# Patient Record
Sex: Female | Born: 1995
Health system: Southern US, Community
[De-identification: ages and names within clinical notes are randomized; demographics above are authoritative.]

## PROBLEM LIST (undated history)

## (undated) DIAGNOSIS — G540 Brachial plexus disorders: Secondary | ICD-10-CM

## (undated) DIAGNOSIS — L709 Acne, unspecified: Secondary | ICD-10-CM

## (undated) DIAGNOSIS — M419 Scoliosis, unspecified: Secondary | ICD-10-CM

## (undated) DIAGNOSIS — F32A Depression, unspecified: Secondary | ICD-10-CM

## (undated) HISTORY — PX: THORACIC OUTLET SURGERY: SHX2502

## (undated) HISTORY — DX: Acne, unspecified: L70.9

## (undated) HISTORY — DX: Scoliosis, unspecified: M41.9

## (undated) HISTORY — DX: Brachial plexus disorders: G54.0

## (undated) HISTORY — DX: Depression, unspecified: F32.A

---
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
## 2017-01-30 MED FILL — NORG-ETHIN ESTRA 0.25-0.035: 0.25-35 | 84 days supply | Qty: 84 | Fill #1

## 2017-04-21 MED FILL — MONO-LINYAH 28 TABLET: 0.25-35 | 84 days supply | Qty: 84 | Fill #2

## 2017-05-16 DIAGNOSIS — J029 Acute pharyngitis, unspecified: Secondary | ICD-10-CM | POA: Diagnosis not present

## 2017-07-21 DIAGNOSIS — H52223 Regular astigmatism, bilateral: Secondary | ICD-10-CM | POA: Diagnosis not present

## 2017-08-14 MED FILL — MONO-LINYAH 28 TABLET: 0.25-35 | 84 days supply | Qty: 84 | Fill #3

## 2017-10-22 DIAGNOSIS — L700 Acne vulgaris: Secondary | ICD-10-CM | POA: Diagnosis not present

## 2017-10-22 MED FILL — CLINDAMYCIN PHOS-BENZOYL PE: 1-5 | 30 days supply | Qty: 50 | Fill #0

## 2017-10-22 MED FILL — PREVIFEM 0.25-35 MG-MCG TAB: 0.25-35 | 84 days supply | Qty: 84 | Fill #0

## 2017-12-14 ENCOUNTER — Ambulatory Visit (INDEPENDENT_AMBULATORY_CARE_PROVIDER_SITE_OTHER): Payer: 59 | Admitting: Family Medicine

## 2017-12-14 ENCOUNTER — Ambulatory Visit (INDEPENDENT_AMBULATORY_CARE_PROVIDER_SITE_OTHER): Payer: 59

## 2017-12-14 ENCOUNTER — Encounter: Payer: Self-pay | Admitting: Family Medicine

## 2017-12-14 VITALS — BP 122/59 | HR 65 | Wt 144.0 lb

## 2017-12-14 DIAGNOSIS — M4126 Other idiopathic scoliosis, lumbar region: Secondary | ICD-10-CM

## 2017-12-14 DIAGNOSIS — M24852 Other specific joint derangements of left hip, not elsewhere classified: Secondary | ICD-10-CM

## 2017-12-14 DIAGNOSIS — M48061 Spinal stenosis, lumbar region without neurogenic claudication: Secondary | ICD-10-CM | POA: Diagnosis not present

## 2017-12-14 DIAGNOSIS — M5416 Radiculopathy, lumbar region: Secondary | ICD-10-CM

## 2017-12-14 DIAGNOSIS — M419 Scoliosis, unspecified: Secondary | ICD-10-CM

## 2017-12-14 DIAGNOSIS — M7062 Trochanteric bursitis, left hip: Secondary | ICD-10-CM

## 2017-12-14 DIAGNOSIS — M25552 Pain in left hip: Secondary | ICD-10-CM | POA: Diagnosis not present

## 2017-12-14 DIAGNOSIS — M545 Low back pain: Secondary | ICD-10-CM | POA: Diagnosis not present

## 2017-12-14 HISTORY — DX: Scoliosis, unspecified: M41.9

## 2017-12-14 NOTE — Progress Notes (Signed)
Belinda Bryan is a 22 y.o. female who presents to [facility] today for hip pain and popping.   She first noticed pain and popping in her left hip 2 years ago when she was playing sand volleyball and felt a pop in her hip. She describes it as popped out of place and then popped back into place. Since this incident she reports pain and numbness in her leg and hip 1-2x monthly. She says her leg gets numb in what sounds like the L4 distribution down to her foot. She also reports recently feeling another large pop which she describes as popped out of place and then popped back in when she was doing an external rotation move while stretching in health class. She does not take anything for pain. She says the pain is both in the lateral hip and in the groin.    ROS:  No headache, visual changes, nausea, vomiting, diarrhea, constipation, dizziness, abdominal pain, skin rash, fevers, chills, night sweats, weight loss, swollen lymph nodes, body aches, joint swelling, muscle aches, chest pain, shortness of breath, mood changes, visual or auditory hallucinations.    Exam:  BP (!) 122/59   Pulse 65   Wt 144 lb (65.3 kg)   BMI 23.24 kg/m  General: Well Developed, well nourished, and in no acute distress.  Neuro/Psych: Alert and oriented x3, extra-ocular muscles intact, able to move all 4 extremities, sensation grossly intact. Skin: Warm and dry, no rashes noted.  Respiratory: Not using accessory muscles, speaking in full sentences, trachea midline.  Cardiovascular: Pulses palpable, no extremity edema. Abdomen: Does not appear distended. MSK: L-spine: Nontender to spinal midline.  Normal lumbar motion. Positive left-sided slump test Lower extremity reflexes are equal normal throughout bilaterally.  Right hip: Normal-appearing nontender normal motion.  Hip abduction strength diminished 4+/5.  Left hip: Normal-appearing normal motion.  Tender palpation  greater trochanter. Hip abduction strength diminished 4/5.  Hip external rotation strength intact 5/5.  Leg lengths equal bilaterally.   Lab and Radiology Results X-ray images personally independently reviewed  L-spine: Mild scoliosis convex to the right.  No severe degenerative changes.  Mild neuroforaminal narrowing at L4.  No acute fractures or changes.  Left hip: Normal-appearing no degenerative changes no fractures.  Await formal radiology review.   Assessment and Plan: 22 y.o. female with left hip pain. Her radicular pain is likely due to pinched L4 nerve root secondary to her scoliosis. Her pain distribution is perfectly in the L4 distribution and her slump test was positive. 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 Her x ray was normal which is reassuring that she does not have a congential hip dysplasia, but we are still waiting on a formal radiology report. The plan will be to do physical therapy for her snapping hip syndrome and recheck. As for her L4 radicular symptoms, she is only having symptoms 1-2x monthly. We will not start medications or do further testing at this time. If her symptoms worsen or do not resolve, an L spine MRI could be indicated.     Orders Placed This Encounter  Procedures  . DG Lumbar Spine Complete    Standing Status:   Future    Number of Occurrences:   1    Standing Expiration Date:   02/14/2019    Order Specific Question:   Reason for Exam (SYMPTOM  OR DIAGNOSIS REQUIRED)    Answer:   eval ? left  L4 nerve radicular pain    Order Specific Question:   Is patient pregnant?    Answer:   No    Order Specific Question:   Preferred imaging location?    Answer:   [facility]    Order Specific Question:   Radiology Contrast Protocol - do NOT remove file path    Answer:   \\[email or web address].[email or web address]  . DG HIP UNILAT WITH PELVIS 2-3 VIEWS LEFT     Standing Status:   Future    Number of Occurrences:   1    Standing Expiration Date:   02/14/2019    Order Specific Question:   Reason for Exam (SYMPTOM  OR DIAGNOSIS REQUIRED)    Answer:   eval ? hip sublx    Order Specific Question:   Is patient pregnant?    Answer:   No    Order Specific Question:   Preferred imaging location?    Answer:   [facility]    Order Specific Question:   Radiology Contrast Protocol - do NOT remove file path    Answer:   \\[email or web address].[email or web address]  . Ambulatory referral to Physical Therapy    Referral Priority:   Routine    Referral Type:   Physical Medicine    Referral Reason:   Specialty Services Required    Requested Specialty:   Physical Therapy    Number of Visits Requested:   1   No orders of the defined types were placed in this encounter.   Historical information moved to improve visibility of documentation.  Past Medical History:  Diagnosis Date  . Acne 07/16/2014  . Lumbar scoliosis 12/14/2017  . Thoracic outlet syndrome 05/17/2014   Past Surgical History:  Procedure Laterality Date  . THORACIC OUTLET SURGERY     Social History   Tobacco Use  . Smoking status: Never Smoker  . Smokeless tobacco: Never Used  Substance Use Topics  . Alcohol use: No    Alcohol/week: 0.0 standard drinks   family history includes COPD in her paternal grandmother; Heart block in her paternal grandmother; Hyperlipidemia in her maternal grandmother and paternal grandfather; Hypertension in her maternal grandmother.  Medications: Current Outpatient Medications  Medication Sig Dispense Refill  . Multiple Vitamins-Minerals (MULTIVITAMIN WITH MINERALS) tablet Take 1 tablet by mouth daily.    . norgestimate-ethinyl estradiol (ORTHO-CYCLEN,SPRINTEC,PREVIFEM) 0.25-35 MG-MCG tablet Take 1 tablet by mouth daily.    . tazarotene (TAZORAC) 0.05 % cream Apply topically at bedtime.     No current facility-administered medications  for this visit.    No Known Allergies    Discussed warning signs or symptoms. Please see discharge instructions. Patient expresses understanding.  I personally was present and performed or re-performed the history, physical exam and medical decision-making activities of this service and have verified that the service and findings are accurately documented in the student's note. ___________________________________________ Clementeen Graham M.D., ABFM., CAQSM. Primary Care and Sports Medicine Adjunct Instructor of Family Medicine  [employer]

## 2017-12-14 NOTE — Patient Instructions (Signed)
Thank you for coming in today. I think you have a pinched nerve in the back.  I think you have weak hip muscles (hip abductors) Attend PT.  If the radiating pain gets bad let me know and I will prescribe prednisone and gabapentin.   Come back or go to the emergency room if you notice new weakness new numbness problems walking or bowel or bladder problems.   Recheck in about 2 months.   Return sooner if needed.    Sciatica Sciatica is pain, numbness, weakness, or tingling along the path of the sciatic nerve. The sciatic nerve starts in the lower back and runs down the back of each leg. The nerve controls the muscles in the lower leg and in the back of the knee. It also provides feeling (sensation) to the back of the thigh, the lower leg, and the sole of the foot. Sciatica is a symptom of another medical condition that pinches or puts pressure on the sciatic nerve. Generally, sciatica only affects one side of the body. Sciatica usually goes away on its own or with treatment. In some cases, sciatica may keep coming back (recur). What are the causes? This condition is caused by pressure on the sciatic nerve, or pinching of the sciatic nerve. This may be the result of:  A disk in between the bones of the spine (vertebrae) bulging out too far (herniated disk).  Age-related changes in the spinal disks (degenerative disk disease).  A pain disorder that affects a muscle in the buttock (piriformis syndrome).  Extra bone growth (bone spur) near the sciatic nerve.  An injury or break (fracture) of the pelvis.  Pregnancy.  Tumor (rare).  What increases the risk? The following factors may make you more likely to develop this condition:  Playing sports that place pressure or stress on the spine, such as football or weight lifting.  Having poor strength and flexibility.  A history of back injury.  A history of back surgery.  Sitting for long periods of time.  Doing activities that  involve repetitive bending or lifting.  Obesity.  What are the signs or symptoms? Symptoms can vary from mild to very severe, and they may include:  Any of these problems in the lower back, leg, hip, or buttock: ? Mild tingling or dull aches. ? Burning sensations. ? Sharp pains.  Numbness in the back of the calf or the sole of the foot.  Leg weakness.  Severe back pain that makes movement difficult.  These symptoms may get worse when you cough, sneeze, or laugh, or when you sit or stand for long periods of time. Being overweight may also make symptoms worse. In some cases, symptoms may recur over time. How is this diagnosed? This condition may be diagnosed based on:  Your symptoms.  A physical exam. Your health care provider may ask you to do certain movements to check whether those movements trigger your symptoms.  You may have tests, including: ? Blood tests. ? X-rays. ? MRI. ? CT scan.  How is this treated? In many cases, this condition improves on its own, without any treatment. However, treatment may include:  Reducing or modifying physical activity during periods of pain.  Exercising and stretching to strengthen your abdomen and improve the flexibility of your spine.  Icing and applying heat to the affected area.  Medicines that help: ? To relieve pain and swelling. ? To relax your muscles.  Injections of medicines that help to relieve pain, irritation, and inflammation  around the sciatic nerve (steroids).  Surgery.  Follow these instructions at home: Medicines  Take over-the-counter and prescription medicines only as told by your health care provider.  Do not drive or operate heavy machinery while taking prescription pain medicine. Managing pain  If directed, apply ice to the affected area. ? Put ice in a plastic bag. ? Place a towel between your skin and the bag. ? Leave the ice on for 20 minutes, 2-3 times a day.  After icing, apply heat to the  affected area before you exercise or as often as told by your health care provider. Use the heat source that your health care provider recommends, such as a moist heat pack or a heating pad. ? Place a towel between your skin and the heat source. ? Leave the heat on for 20-30 minutes. ? Remove the heat if your skin turns bright red. This is especially important if you are unable to feel pain, heat, or cold. You may have a greater risk of getting burned. Activity  Return to your normal activities as told by your health care provider. Ask your health care provider what activities are safe for you. ? Avoid activities that make your symptoms worse.  Take brief periods of rest throughout the day. Resting in a lying or standing position is usually better than sitting to rest. ? When you rest for longer periods, mix in some mild activity or stretching between periods of rest. This will help to prevent stiffness and pain. ? Avoid sitting for long periods of time without moving. Get up and move around at least one time each hour.  Exercise and stretch regularly, as told by your health care provider.  Do not lift anything that is heavier than 10 lb (4.5 kg) while you have symptoms of sciatica. When you do not have symptoms, you should still avoid heavy lifting, especially repetitive heavy lifting.  When you lift objects, always use proper lifting technique, which includes: ? Bending your knees. ? Keeping the load close to your body. ? Avoiding twisting. General instructions  Use good posture. ? Avoid leaning forward while sitting. ? Avoid hunching over while standing.  Maintain a healthy weight. Excess weight puts extra stress on your back and makes it difficult to maintain good posture.  Wear supportive, comfortable shoes. Avoid wearing high heels.  Avoid sleeping on a mattress that is too soft or too hard. A mattress that is firm enough to support your back when you sleep may help to reduce  your pain.  Keep all follow-up visits as told by your health care provider. This is important. Contact a health care provider if:  You have pain that wakes you up when you are sleeping.  You have pain that gets worse when you lie down.  Your pain is worse than you have experienced in the past.  Your pain lasts longer than 4 weeks.  You experience unexplained weight loss. Get help right away if:  You lose control of your bowel or bladder (incontinence).  You have: ? Weakness in your lower back, pelvis, buttocks, or legs that gets worse. ? Redness or swelling of your back. ? A burning sensation when you urinate. This information is not intended to replace advice given to you by your health care provider. Make sure you discuss any questions you have with your health care provider. Document Released: 02/18/2001 Document Revised: 07/31/2015 Document Reviewed: 11/03/2014 Elsevier Interactive Patient Education  2018 [employer]    Snapping Hip  Syndrome Rehab Ask your health care provider which exercises are safe for you. Do exercises exactly as told by your health care provider and adjust them as directed. It is normal to feel mild stretching, pulling, tightness, or discomfort as you do these exercises, but you should stop right away if you feel sudden pain or your pain gets worse. Do not begin these exercises until told by your health care provider. Stretching and range of motion exercises These exercises warm up your muscles and joints and improve the movement and flexibility of your hip and pelvis. These exercises also help to relieve pain and stiffness. Exercise A: Hip rotators  1. Lie on your back on a firm surface. 2. Pull your left / right knee toward your same shoulder with your left / right hand until your knee is pointing toward the ceiling. Hold your left / right ankle with your other hand. 3. Keeping your knee steady, gently pull your ankle toward your other shoulder until  you feel a stretch in your buttocks. 4. Hold this position for __________ seconds. 5. Slowly return to the starting position. Repeat __________ times. Complete this stretch __________ times a day. Exercise B: Iliotibial band  1. Lie on your side with your left / right leg in the top position. 2. Bend your left / right knee and grab your ankle. 3. Slowly bring your knee back so your thigh is behind your body. 4. Slowly lower your knee toward the floor until you feel a gentle stretch on the outside of your left / right thigh. If you do not feel a stretch and your knee will not fall farther, place the heel of your other foot on top of your outer knee and pull your thigh down farther. 5. Hold this position for __________ seconds. 6. Slowly return to the starting position. Repeat __________ times. Complete this stretch __________ times a day. Exercise C: Lunge ( hip flexors) 1. Kneel on the floor on your left / right knee. Bend your other knee so it is directly over your ankle. 2. Keep good posture with your head over your shoulders. Tuck your tailbone underneath you. This will prevent your back from arching too much. 3. You should feel a gentle stretch in the front of your thigh or hip. If you do not feel a stretch, slowly lunge forward with your chest up. 4. Hold this position for __________ seconds. Repeat __________ times. Complete this exercise __________ times a day. Strengthening exercises These exercises build strength and endurance in your hip and pelvis. Endurance is the ability to use your muscles for a long time, even after they get tired. Exercise D: Straight leg raises ( hip abductors) 1. Lie on your side so your left / right leg is in the top position. Lie so your head, shoulder, knee, and hip line up. Bend your bottom knee to help you balance. 2. Lift your top leg up 4-6 inches (10-15 cm), keeping your toes pointed straight ahead. 3. Hold this position for __________  seconds. 4. Slowly lower your leg to the starting position. Let your muscles relax completely. Repeat __________ times. Complete this exercise __________ times a day. Exercise E: Hip abductors and rotators, quadruped  1. Get on your hands and knees on a firm, lightly padded surface. Your hands should be directly below your shoulders, and your knees should be directly below your hips. 2. Lift your left / right knee out to the side. Keep your knee bent. Do not twist your  body. 3. Hold this position for __________ seconds. 4. Slowly lower your leg. Repeat __________ times. Complete this exercise __________ times a day. This information is not intended to replace advice given to you by your health care provider. Make sure you discuss any questions you have with your health care provider. Document Released: 02/24/2005 Document Revised: 10/30/2015 Document Reviewed: 02/06/2015 Elsevier Interactive Patient Education  [employer].

## 2017-12-31 ENCOUNTER — Encounter: Payer: Self-pay | Admitting: Family Medicine

## 2017-12-31 ENCOUNTER — Telehealth: Payer: Self-pay

## 2017-12-31 MED ORDER — GABAPENTIN 300 MG PO CAPS: ORAL_CAPSULE | ORAL | 3 refills | Status: DC

## 2017-12-31 MED ORDER — PREDNISONE 5 MG (48) PO TBPK: ORAL_TABLET | ORAL | 0 refills | Status: DC

## 2017-12-31 MED FILL — GABAPENTIN 300 MG CAPSULE: 300 | 60 days supply | Qty: 180 | Fill #0

## 2017-12-31 MED FILL — predniSONE 5 MG TABS: 5 | 12 days supply | Qty: 48 | Fill #0

## 2017-12-31 NOTE — Telephone Encounter (Signed)
Prednisone and gabapentin sent to pharmacy.  Letter printed and will be faxed.

## 2017-12-31 NOTE — Telephone Encounter (Signed)
Pt advised. No further questions.  

## 2017-12-31 NOTE — Telephone Encounter (Signed)
Patient called stated that she needs a letter stating that she can not participate in the Pacer Test due to her having hip pain and nerve issues. Patient is wanting to go ahead and have the Gabapentin and Prednisone that was discussed during her visit to be sent to [facility] pharmacy. Please advise letter is to be faxed to [phone]. Jasmine Mcbeth,CMA

## 2018-01-22 DIAGNOSIS — R531 Weakness: Secondary | ICD-10-CM | POA: Diagnosis not present

## 2018-01-22 DIAGNOSIS — M79662 Pain in left lower leg: Secondary | ICD-10-CM | POA: Diagnosis not present

## 2018-01-22 DIAGNOSIS — M25552 Pain in left hip: Secondary | ICD-10-CM | POA: Diagnosis not present

## 2018-01-22 DIAGNOSIS — M545 Low back pain: Secondary | ICD-10-CM | POA: Diagnosis not present

## 2018-01-27 DIAGNOSIS — R531 Weakness: Secondary | ICD-10-CM | POA: Diagnosis not present

## 2018-01-27 DIAGNOSIS — M79662 Pain in left lower leg: Secondary | ICD-10-CM | POA: Diagnosis not present

## 2018-01-27 DIAGNOSIS — M545 Low back pain: Secondary | ICD-10-CM | POA: Diagnosis not present

## 2018-01-27 DIAGNOSIS — M25552 Pain in left hip: Secondary | ICD-10-CM | POA: Diagnosis not present

## 2018-02-03 ENCOUNTER — Telehealth: Payer: Self-pay | Admitting: Family Medicine

## 2018-02-03 DIAGNOSIS — M545 Low back pain: Secondary | ICD-10-CM | POA: Diagnosis not present

## 2018-02-03 DIAGNOSIS — R531 Weakness: Secondary | ICD-10-CM | POA: Diagnosis not present

## 2018-02-03 DIAGNOSIS — M25552 Pain in left hip: Secondary | ICD-10-CM | POA: Diagnosis not present

## 2018-02-03 DIAGNOSIS — M79662 Pain in left lower leg: Secondary | ICD-10-CM | POA: Diagnosis not present

## 2018-02-03 NOTE — Telephone Encounter (Signed)
Completed form for [facility] regarding PACER test. Will fax back today.

## 2018-02-05 MED FILL — PREVIFEM 0.25-35 MG-MCG TAB: 0.25-35 | 84 days supply | Qty: 84 | Fill #1

## 2018-02-12 DIAGNOSIS — R531 Weakness: Secondary | ICD-10-CM | POA: Diagnosis not present

## 2018-02-12 DIAGNOSIS — M545 Low back pain: Secondary | ICD-10-CM | POA: Diagnosis not present

## 2018-02-12 DIAGNOSIS — M25552 Pain in left hip: Secondary | ICD-10-CM | POA: Diagnosis not present

## 2018-02-23 ENCOUNTER — Ambulatory Visit: Payer: 59 | Admitting: Family Medicine

## 2018-03-15 ENCOUNTER — Encounter: Payer: Self-pay | Admitting: Family Medicine

## 2018-03-15 ENCOUNTER — Ambulatory Visit (INDEPENDENT_AMBULATORY_CARE_PROVIDER_SITE_OTHER): Payer: 59 | Admitting: Family Medicine

## 2018-03-15 VITALS — BP 124/60 | HR 76 | Ht 65.0 in | Wt 150.0 lb

## 2018-03-15 DIAGNOSIS — M5416 Radiculopathy, lumbar region: Secondary | ICD-10-CM | POA: Diagnosis not present

## 2018-03-15 DIAGNOSIS — M24852 Other specific joint derangements of left hip, not elsewhere classified: Secondary | ICD-10-CM

## 2018-03-15 DIAGNOSIS — M7062 Trochanteric bursitis, left hip: Secondary | ICD-10-CM | POA: Diagnosis not present

## 2018-03-15 DIAGNOSIS — M4126 Other idiopathic scoliosis, lumbar region: Secondary | ICD-10-CM | POA: Diagnosis not present

## 2018-03-15 HISTORY — DX: Trochanteric bursitis, left hip: M70.62

## 2018-03-15 HISTORY — DX: Other specific joint derangements of left hip, not elsewhere classified: M24.852

## 2018-03-15 HISTORY — DX: Radiculopathy, lumbar region: M54.16

## 2018-03-15 NOTE — Patient Instructions (Addendum)
Thank you for coming in today. You should hear about MRI soon.  Let me know in 1 week if you do not hear anything.   Attend PT at [facility]. Will will try to schedule it.   Recheck with me when back in town.   Let me know sooner if not improving.   Epidural Steroid Injection Patient Information  Description: The epidural space surrounds the nerves as they exit the spinal cord.  In some patients, the nerves can be compressed and inflamed by a bulging disc or a tight spinal canal (spinal stenosis).  By injecting steroids into the epidural space, we can bring irritated nerves into direct contact with a potentially helpful medication.  These steroids act directly on the irritated nerves and can reduce swelling and inflammation which often leads to decreased pain.  Epidural steroids may be injected anywhere along the spine and from the neck to the low back depending upon the location of your pain.   After numbing the skin with local anesthetic (like Novocaine), a small needle is passed into the epidural space slowly.  You may experience a sensation of pressure while this is being done.  The entire block usually last less than 10 minutes.  Conditions which may be treated by epidural steroids:   Low back and leg pain  Neck and arm pain  Spinal stenosis  Post-laminectomy syndrome  Herpes zoster (shingles) pain  Pain from compression fractures  Preparation for the injection:  1. Do not eat any solid food or dairy products within 8 hours of your appointment.  2. You may drink clear liquids up to 3 hours before appointment.  Clear liquids include water, black coffee, juice or soda.  No milk or cream please. 3. You may take your regular medication, including pain medications, with a sip of water before your appointment  Diabetics should hold regular insulin (if taken separately) and take 1/2 normal NPH dos the morning of the procedure.  Carry some sugar containing items with you to your  appointment. 4. A driver must accompany you and be prepared to drive you home after your procedure.  5. Bring all your current medications with your. 6. An IV may be inserted and sedation may be given at the discretion of the physician.   7. A blood pressure cuff, EKG and other monitors will often be applied during the procedure.  Some patients may need to have extra oxygen administered for a short period. 8. You will be asked to provide medical information, including your allergies, prior to the procedure.  We must know immediately if you are taking blood thinners (like Coumadin/Warfarin)  Or if you are allergic to IV iodine contrast (dye). We must know if you could possible be pregnant.  Possible side-effects:  Bleeding from needle site  Infection (rare, may require surgery)  Nerve injury (rare)  Numbness & tingling (temporary)  Difficulty urinating (rare, temporary)  Spinal headache ( a headache worse with upright posture)  Light -headedness (temporary)  Pain at injection site (several days)  Decreased blood pressure (temporary)  Weakness in arm/leg (temporary)  Pressure sensation in back/neck (temporary)  Call if you experience:  Fever/chills associated with headache or increased back/neck pain.  Headache worsened by an upright position.  New onset weakness or numbness of an extremity below the injection site  Hives or difficulty breathing (go to the emergency room)  Inflammation or drainage at the infection site  Severe back/neck pain  Any new symptoms which are concerning to you  Please note:  Although the local anesthetic injected can often make your back or neck feel good for several hours after the injection, the pain will likely return.  It takes 3-7 days for steroids to work in the epidural space.  You may not notice any pain relief for at least that one week.  If effective, we will often do a series of three injections spaced 3-6 weeks apart to maximally  decrease your pain.  After the initial series, we generally will wait several months before considering a repeat injection of the same type.

## 2018-03-15 NOTE — Progress Notes (Signed)
Belinda Bryan is a 23 y.o. female who presents to [facility] today for left lateral hip pain and left lumbar radicular pain.   Belinda Bryan was seen in October for left lateral hip pain with pain radiating down her left leg.  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  She had a few sessions of physical therapy which helped some.  She does note continued pain in her left lateral hip worse with activity better with rest.  She has trouble trouble sleeping because of pain in her left side.  She notes pain can be as bad as a 7 out of 10 and is interfering with her ability to exercise normally  Additionally she continues to experience pain radiating down her left leg.  She has scoliosis with transitional vertebrae and was thought to have left lumbar radiculopathy at L4 nerve root.  She has had some physical therapy which helped a bit.  She continues experience pain but denies any weakness or numbness or bowel bladder dysfunction.  Pain is also obnoxious and does interfere with her quality of life.  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    ROS:  As above  Exam:  BP 124/60   Pulse 76   Ht 5\' 5"  (1.651 m)   Wt 150 lb (68 kg)   BMI 24.96 kg/m  Wt Readings from Last 5 Encounters:  03/15/18 150 lb (68 kg)  12/14/17 144 lb (65.3 kg)  12/17/15 148 lb 9.6 oz (67.4 kg) (82 %, Z= 0.93)*  06/01/15 139 lb 6.4 oz (63.2 kg) (75 %, Z= 0.68)*  05/02/15 140 lb 9.6 oz (63.8 kg) (77 %, Z= 0.73)*   * Growth percentiles are based on CDC (Girls, 2-20 Years) data.   General: Well Developed, well nourished, and in no acute distress.  Neuro/Psych: Alert and oriented x3, extra-ocular muscles intact, able to move all 4 extremities, sensation grossly intact. Skin: Warm and dry, no rashes noted.  Respiratory: Not using accessory muscles, speaking in full sentences, trachea midline.    Cardiovascular: Pulses palpable, no extremity edema. Abdomen: Does not appear distended. MSK:  L-spine nontender to midline normal lumbar motion.  Negative left-sided straight leg raise test. Intact strength and reflexes bilaterally.  Sensation is intact throughout.  Left hip normal-appearing normal motion. Tender palpation greater trochanter.  Hip abduction strength 5/5 mild pain. Right hip normal-appearing nontender normal motion normal strength.     Lab and Radiology Results EXAM: DG HIP (WITH OR WITHOUT PELVIS) 2-3V LEFT  COMPARISON:  None.  FINDINGS: There is no evidence of hip fracture or dislocation. There is no evidence of arthropathy or other focal bone abnormality. No plain film evidence of left femoral head avascular necrosis.  IMPRESSION: Negative.   Electronically Signed   By: Lacy DuverneySteven  Olson M.D.   On: 12/14/2017 12:13  EXAM: LUMBAR SPINE - COMPLETE 4+ VIEW  COMPARISON:  None.  FINDINGS: Scoliosis lumbar spine convex right. Transitional L5 vertebra with articulation with the upper aspect of the right sacrum.  L1-2 minimal disc space narrowing. L2-3 minimal left-sided disc space narrowing. L3-4 minimal disc space narrowing. L4-5 mild disc space narrowing greater on the right. L5-S1 mild disc space narrowing. No compression fracture or pars defect.  IMPRESSION: 1. Scoliosis lumbar spine convex right. 2. 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 3. Scattered minimal to mild degenerative changes as  noted above.   Electronically Signed   By: Lacy Duverney M.D.   On: 12/14/2017 12:12  I personally (independently) visualized and performed the interpretation of the images attached in this note.     Assessment and Plan: 23 y.o. female with  Left lumbar radicular pain likely L4.  Patient has scoliosis and some degenerative changes.  Symptoms present for greater than 6 weeks despite  physician directed conservative management including physical therapy.  Plan for MRI and plan for epidural steroid injection planning.  We will attempt to obtain these tests and potential treatment in [facility] where she goes to school at [employer].  Left lateral hip pain: More likely trochanteric bursitis versus external snapping hip syndrome.  Patient has had some physical therapy but not maximized it.  Plan to continue physical therapy at [employer] and recheck if not improving.   PDMP not reviewed this encounter. Orders Placed This Encounter  Procedures  . MR Lumbar Spine Wo Contrast    Goes to school at [employer]. Please arrange MRI to be done there.    Standing Status:   Future    Standing Expiration Date:   05/14/2019    Scheduling Instructions:     Goes to school at [employer]. Please arrange MRI to be done there.    Order Specific Question:   What is the patient's sedation requirement?    Answer:   No Sedation    Order Specific Question:   Does the patient have a pacemaker or implanted devices?    Answer:   No    Order Specific Question:   Preferred imaging location?    Answer:   External    Order Specific Question:   Radiology Contrast Protocol - do NOT remove file path    Answer:   \\[email or web address]  . Ambulatory referral to Physical Therapy    Referral Priority:   Routine    Referral Type:   Physical Medicine    Referral Reason:   Specialty Services Required    Requested Specialty:   Physical Therapy    Number of Visits Requested:   1   No orders of the defined types were placed in this encounter.   Historical information moved to improve visibility of documentation.  Past Medical History:  Diagnosis Date  . Acne 07/16/2014  . Lumbar scoliosis 12/14/2017  . Thoracic outlet syndrome 05/17/2014   Past Surgical History:  Procedure Laterality Date  . THORACIC OUTLET SURGERY     Social History   Tobacco Use  . Smoking status: Never Smoker  . Smokeless  tobacco: Never Used  Substance Use Topics  . Alcohol use: No    Alcohol/week: 0.0 standard drinks   family history includes COPD in her paternal grandmother; Heart block in her paternal grandmother; Hyperlipidemia in her maternal grandmother and paternal grandfather; Hypertension in her maternal grandmother.  Medications: Current Outpatient Medications  Medication Sig Dispense Refill  . Multiple Vitamins-Minerals (MULTIVITAMIN WITH MINERALS) tablet Take 1 tablet by mouth daily.    . norgestimate-ethinyl estradiol (ORTHO-CYCLEN,SPRINTEC,PREVIFEM) 0.25-35 MG-MCG tablet Take 1 tablet by mouth daily.    . tazarotene (TAZORAC) 0.05 % cream Apply topically at bedtime.     No current facility-administered medications for this visit.    No Known Allergies    Discussed warning signs or symptoms. Please see discharge instructions. Patient expresses understanding.

## 2018-03-24 DIAGNOSIS — M25562 Pain in left knee: Secondary | ICD-10-CM | POA: Diagnosis not present

## 2018-03-24 DIAGNOSIS — M25552 Pain in left hip: Secondary | ICD-10-CM | POA: Diagnosis not present

## 2018-03-29 ENCOUNTER — Ambulatory Visit (INDEPENDENT_AMBULATORY_CARE_PROVIDER_SITE_OTHER): Payer: 59

## 2018-03-29 DIAGNOSIS — M24852 Other specific joint derangements of left hip, not elsewhere classified: Secondary | ICD-10-CM

## 2018-03-29 DIAGNOSIS — M4126 Other idiopathic scoliosis, lumbar region: Secondary | ICD-10-CM | POA: Diagnosis not present

## 2018-03-29 DIAGNOSIS — M7062 Trochanteric bursitis, left hip: Secondary | ICD-10-CM | POA: Diagnosis not present

## 2018-03-29 DIAGNOSIS — M5416 Radiculopathy, lumbar region: Secondary | ICD-10-CM | POA: Diagnosis not present

## 2018-03-29 DIAGNOSIS — M4186 Other forms of scoliosis, lumbar region: Secondary | ICD-10-CM | POA: Diagnosis not present

## 2018-03-29 DIAGNOSIS — M5126 Other intervertebral disc displacement, lumbar region: Secondary | ICD-10-CM | POA: Diagnosis not present

## 2018-04-29 MED FILL — PREVIFEM 0.25-35 MG-MCG TAB: 0.25-35 | 84 days supply | Qty: 84 | Fill #2

## 2018-05-05 MED FILL — TRETINOIN 0.1% CREAM: 0.1 | 20 days supply | Qty: 20 | Fill #0

## 2018-08-12 MED FILL — PREVIFEM 0.25-35 MG-MCG TAB: 0.25-35 | 84 days supply | Qty: 84 | Fill #3

## 2018-10-05 ENCOUNTER — Other Ambulatory Visit: Payer: Self-pay

## 2018-10-05 ENCOUNTER — Ambulatory Visit (INDEPENDENT_AMBULATORY_CARE_PROVIDER_SITE_OTHER): Payer: 59 | Admitting: Family Medicine

## 2018-10-05 ENCOUNTER — Encounter: Payer: Self-pay | Admitting: Family Medicine

## 2018-10-05 VITALS — BP 110/71 | HR 85 | Temp 98.1°F | Ht 65.0 in | Wt 159.7 lb

## 2018-10-05 DIAGNOSIS — M5416 Radiculopathy, lumbar region: Secondary | ICD-10-CM | POA: Diagnosis not present

## 2018-10-05 NOTE — Progress Notes (Signed)
Belinda Bryan is a 23 y.o. female who presents to [place] today for follow-up left leg pain.  Patient was seen for leg pain and hip pain in the fall 2019.  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  Ultimately she had lumbar MRI which showed some abnormalities but no clear nerve impingement.  In the interim she notes continued intermittent pain.  She has about once a week pain shooting down her left leg to the lateral calf and dorsal foot.  This typically is associate with a cold feeling in her leg as well.  Sometimes she has some hip pain with this as well.  She denies weakness or numbness or loss of function.  She denies any bowel bladder dysfunction.  She did symptoms are moderately obnoxious and are not improving with physical therapy attempts at [employer].    ROS:  As above  Exam:  BP 110/71   Pulse 85   Temp 98.1 F (36.7 C) (Oral)   Ht 5\' 5"  (1.651 m)   Wt 159 lb 11.2 oz (72.4 kg)   BMI 26.58 kg/m  Wt Readings from Last 5 Encounters:  10/05/18 159 lb 11.2 oz (72.4 kg)  03/15/18 150 lb (68 kg)  12/14/17 144 lb (65.3 kg)  12/17/15 148 lb 9.6 oz (67.4 kg) (82 %, Z= 0.93)*  06/01/15 139 lb 6.4 oz (63.2 kg) (75 %, Z= 0.68)*   * Growth percentiles are based on CDC (Girls, 2-20 Years) data.   General: Well Developed, well nourished, and in no acute distress.  Neuro/Psych: Alert and oriented x3, extra-ocular muscles intact, able to move all 4 extremities, sensation grossly intact. Skin: Warm and dry, no rashes noted.  Respiratory: Not using accessory muscles, speaking in full sentences, trachea midline.  Cardiovascular: Pulses palpable, no extremity edema. Abdomen: Does not appear distended. MSK:  Spine nontender to spinal midline normal lumbar motion positive slump test left side.  Lower extremity strength reflexes and sensation are intact. Left hip normal-appearing normal motion  nontender normal strength. Normal gait.    Lab and Radiology Results EXAM: MRI LUMBAR SPINE WITHOUT CONTRAST  TECHNIQUE: Multiplanar, multisequence MR imaging of the lumbar spine was performed. No intravenous contrast was administered.  COMPARISON:  Radiographs 12/14/2017  FINDINGS: Segmentation: There are five lumbar type vertebral bodies. The last full intervertebral disc space is labeled L5-S1. This correlates with the radiographs. There is partial sacralization of L5 on the right side.  Alignment: Moderate right convex lumbar scoliosis but normal overall alignment in the sagittal plane.  Vertebrae:  Normal marrow signal.  No bone lesions or fracture.  Conus medullaris and cauda equina: Conus extends to the T12-L1 level. Conus and cauda equina appear normal.  Paraspinal and other soft tissues: No significant findings.  Disc levels:  T12-L1: No significant findings.  L1-2: No significant findings.  L2-3: No significant findings.  L3-4: Slight bulging disc but no disc protrusion, spinal or foraminal stenosis.  L4-5: Slight bulging annulus but no focal disc protrusion, spinal or foraminal stenosis. Mild/early facet disease, left greater than right.  L5-S1: No focal disc protrusions, spinal or foraminal stenosis.  IMPRESSION: 1. Moderate right convex lumbar scoliosis estimated at 15 degrees. 2. Partial sacralization of L5 on the right side. 3. Slight bulging discs at L3-4 and L4-5 but no focal disc protrusions, spinal or foraminal stenosis in the lumbar spine.   Electronically Signed   By: Ricky Stabs.D.  On: 03/29/2018 13:36  I personally (independently) visualized and performed the interpretation of the images attached in this note.      Assessment and Plan: 23 y.o. female with left leg pain.  Pain is likely lumbar radicular in nature.  Dermatome seems to be corresponding with L5 nerve root.  MRI reviewed.  Patient does have  some bulging disc that could possibly press on nerve roots in this area.  Plan for epidural steroid injection at L4-L5 on the left side.  Recheck if not improving.  Precautions reviewed.  Patient is not currently pregnant and uses birth control to avoid pregnancy.  I spent 25 minutes with this patient, greater than 50% was face-to-face time counseling regarding differential diagnosis treatment plan and options of backup plan and next steps.Marland Kitchen.   PDMP not reviewed this encounter. Orders Placed This Encounter  Procedures  . DG INJECT DIAG/THERA/INC NEEDLE/CATH/PLC EPI/LUMB/SAC W/IMG    Standing Status:   Future    Standing Expiration Date:   12/06/2019    Order Specific Question:   Reason for Exam (SYMPTOM  OR DIAGNOSIS REQUIRED)    Answer:   Epidural steroid injection left L4-L5 for left L5 radiculopathy    Order Specific Question:   Is the patient pregnant?    Answer:   No    Order Specific Question:   Preferred Imaging Location?    Answer:   [id number]    Order Specific Question:   Radiology Contrast Protocol - do NOT remove file path    Answer:   \\[email or web address]   No orders of the defined types were placed in this encounter.   Historical information moved to improve visibility of documentation.  Past Medical History:  Diagnosis Date  . Acne 07/16/2014  . Lumbar scoliosis 12/14/2017  . Thoracic outlet syndrome 05/17/2014   Past Surgical History:  Procedure Laterality Date  . THORACIC OUTLET SURGERY     Social History   Tobacco Use  . Smoking status: Never Smoker  . Smokeless tobacco: Never Used  Substance Use Topics  . Alcohol use: No    Alcohol/week: 0.0 standard drinks   family history includes COPD in her paternal grandmother; Heart block in her paternal grandmother; Hyperlipidemia in her maternal grandmother and paternal grandfather; Hypertension in her maternal grandmother.  Medications: Current Outpatient Medications   Medication Sig Dispense Refill  . Multiple Vitamins-Minerals (MULTIVITAMIN WITH MINERALS) tablet Take 1 tablet by mouth daily.    . norgestimate-ethinyl estradiol (ORTHO-CYCLEN,SPRINTEC,PREVIFEM) 0.25-35 MG-MCG tablet Take 1 tablet by mouth daily.    . tazarotene (TAZORAC) 0.05 % cream Apply topically at bedtime.     No current facility-administered medications for this visit.    No Known Allergies    Discussed warning signs or symptoms. Please see discharge instructions. Patient expresses understanding.

## 2018-10-05 NOTE — Patient Instructions (Signed)
Thank you for coming in today.  You should hear about back injection soon.  Let me know if you do not hear anything.  If anyone has questions about it let me know and I can find a time where I can speak with them.   Keep me updated.   Epidural Steroid Injection Patient Information  Description: The epidural space surrounds the nerves as they exit the spinal cord.  In some patients, the nerves can be compressed and inflamed by a bulging disc or a tight spinal canal (spinal stenosis).  By injecting steroids into the epidural space, we can bring irritated nerves into direct contact with a potentially helpful medication.  These steroids act directly on the irritated nerves and can reduce swelling and inflammation which often leads to decreased pain.  Epidural steroids may be injected anywhere along the spine and from the neck to the low back depending upon the location of your pain.   After numbing the skin with local anesthetic (like Novocaine), a small needle is passed into the epidural space slowly.  You may experience a sensation of pressure while this is being done.  The entire block usually last less than 10 minutes.  Conditions which may be treated by epidural steroids:   Low back and leg pain  Neck and arm pain  Spinal stenosis  Post-laminectomy syndrome  Herpes zoster (shingles) pain  Pain from compression fractures  Preparation for the injection:  1. Do not eat any solid food or dairy products within 8 hours of your appointment.  2. You may drink clear liquids up to 3 hours before appointment.  Clear liquids include water, black coffee, juice or soda.  No milk or cream please. 3. You may take your regular medication, including pain medications, with a sip of water before your appointment  Diabetics should hold regular insulin (if taken separately) and take 1/2 normal NPH dos the morning of the procedure.  Carry some sugar containing items with you to your appointment. 4. A  driver must accompany you and be prepared to drive you home after your procedure.  5. Bring all your current medications with your. 6. An IV may be inserted and sedation may be given at the discretion of the physician.   7. A blood pressure cuff, EKG and other monitors will often be applied during the procedure.  Some patients may need to have extra oxygen administered for a short period. 8. You will be asked to provide medical information, including your allergies, prior to the procedure.  We must know immediately if you are taking blood thinners (like Coumadin/Warfarin)  Or if you are allergic to IV iodine contrast (dye). We must know if you could possible be pregnant.  Possible side-effects:  Bleeding from needle site  Infection (rare, may require surgery)  Nerve injury (rare)  Numbness & tingling (temporary)  Difficulty urinating (rare, temporary)  Spinal headache ( a headache worse with upright posture)  Light -headedness (temporary)  Pain at injection site (several days)  Decreased blood pressure (temporary)  Weakness in arm/leg (temporary)  Pressure sensation in back/neck (temporary)  Call if you experience:  Fever/chills associated with headache or increased back/neck pain.  Headache worsened by an upright position.  New onset weakness or numbness of an extremity below the injection site  Hives or difficulty breathing (go to the emergency room)  Inflammation or drainage at the infection site  Severe back/neck pain  Any new symptoms which are concerning to you  Please note:  Although the local anesthetic injected can often make your back or neck feel good for several hours after the injection, the pain will likely return.  It takes 3-7 days for steroids to work in the epidural space.  You may not notice any pain relief for at least that one week.  If effective, we will often do a series of three injections spaced 3-6 weeks apart to maximally decrease your  pain.  After the initial series, we generally will wait several months before considering a repeat injection of the same type.

## 2018-10-12 ENCOUNTER — Other Ambulatory Visit: Payer: 59

## 2018-10-22 ENCOUNTER — Ambulatory Visit
Admission: RE | Admit: 2018-10-22 | Discharge: 2018-10-22 | Disposition: A | Discharge status: Home / Self Care | Payer: 59 | Source: Ambulatory Visit | Attending: Family Medicine | Admitting: Family Medicine

## 2018-10-22 ENCOUNTER — Other Ambulatory Visit: Payer: Self-pay

## 2018-10-22 DIAGNOSIS — M5416 Radiculopathy, lumbar region: Secondary | ICD-10-CM

## 2018-10-22 DIAGNOSIS — M5126 Other intervertebral disc displacement, lumbar region: Secondary | ICD-10-CM | POA: Diagnosis not present

## 2018-10-22 MED ORDER — METHYLPREDNISOLONE ACETATE 40 MG/ML INJ SUSP (RADIOLOG
120.0000 mg | Freq: Once | INTRAMUSCULAR | Status: AC
  Administered 2018-10-22: 15:00:00 120 mg via EPIDURAL

## 2018-10-22 MED ORDER — IOPAMIDOL (ISOVUE-M 200) INJECTION 41%
1.0000 mL | Freq: Once | INTRAMUSCULAR | Status: AC
  Administered 2018-10-22: 1 mL via EPIDURAL

## 2018-10-22 NOTE — Discharge Instructions (Signed)

## 2018-11-12 DIAGNOSIS — L700 Acne vulgaris: Secondary | ICD-10-CM | POA: Diagnosis not present

## 2018-11-12 MED FILL — CLINDAMYCIN PHOS-BENZOYL PE: 1-5 | 30 days supply | Qty: 50 | Fill #0

## 2018-11-12 MED FILL — PREVIFEM 0.25-35 MG-MCG TAB: 0.25-35 | 84 days supply | Qty: 84 | Fill #0

## 2019-02-11 ENCOUNTER — Other Ambulatory Visit: Payer: Self-pay

## 2019-02-14 MED FILL — PREVIFEM 0.25-35 MG-MCG TAB: 0.25-35 | 84 days supply | Qty: 84 | Fill #1

## 2019-02-15 ENCOUNTER — Ambulatory Visit: Payer: 59 | Admitting: Family

## 2019-02-17 ENCOUNTER — Other Ambulatory Visit: Payer: Self-pay

## 2019-02-18 ENCOUNTER — Ambulatory Visit: Payer: 59 | Admitting: Family

## 2019-02-18 ENCOUNTER — Ambulatory Visit (INDEPENDENT_AMBULATORY_CARE_PROVIDER_SITE_OTHER): Payer: 59 | Admitting: Family Medicine

## 2019-02-18 ENCOUNTER — Other Ambulatory Visit: Payer: Self-pay

## 2019-02-18 ENCOUNTER — Encounter: Payer: Self-pay | Admitting: Family Medicine

## 2019-02-18 VITALS — BP 96/70 | HR 94 | Temp 97.4°F | Resp 18 | Ht 65.0 in | Wt 158.4 lb

## 2019-02-18 DIAGNOSIS — Z23 Encounter for immunization: Secondary | ICD-10-CM | POA: Diagnosis not present

## 2019-02-18 DIAGNOSIS — Z020 Encounter for examination for admission to educational institution: Secondary | ICD-10-CM

## 2019-02-18 NOTE — Progress Notes (Signed)
Patient ID: Belinda Bryan, female    DOB: 1995-04-13  Age: 23 y.o. MRN: [id number]    Subjective:  Subjective  HPI Aisling Emigh presents to reestablish care.  She has no complaints   She just needs a tdap for school    Review of Systems  Constitutional: Negative for appetite change, diaphoresis, fatigue and unexpected weight change.  Eyes: Negative for pain, redness and visual disturbance.  Respiratory: Negative for cough, chest tightness, shortness of breath and wheezing.   Cardiovascular: Negative for chest pain, palpitations and leg swelling.  Endocrine: Negative for cold intolerance, heat intolerance, polydipsia, polyphagia and polyuria.  Genitourinary: Negative for difficulty urinating, dysuria and frequency.  Neurological: Negative for dizziness, light-headedness, numbness and headaches.    History Past Medical History:  Diagnosis Date  . Acne 07/16/2014  . Lumbar scoliosis 12/14/2017  . Thoracic outlet syndrome 05/17/2014    She has a past surgical history that includes Thoracic outlet surgery.   Her family history includes COPD in her paternal grandmother; Heart block in her paternal grandmother; Hyperlipidemia in her maternal grandmother and paternal grandfather; Hypertension in her maternal grandmother.She reports that she has never smoked. She has never used smokeless tobacco. She reports current alcohol use of about 1.0 standard drinks of alcohol per week. She reports that she does not use drugs.  Current Outpatient Medications on File Prior to Visit  Medication Sig Dispense Refill  . clindamycin-benzoyl peroxide (BENZACLIN) gel     . Multiple Vitamins-Minerals (MULTIVITAMIN WITH MINERALS) tablet Take 1 tablet by mouth daily.    . norgestimate-ethinyl estradiol (ORTHO-CYCLEN,SPRINTEC,PREVIFEM) 0.25-35 MG-MCG tablet Take 1 tablet by mouth daily.     No current facility-administered medications on file prior to visit.     Objective:  Objective  Physical Exam Vitals  and nursing note reviewed.  Constitutional:      Appearance: She is well-developed.  HENT:     Head: Normocephalic and atraumatic.  Eyes:     Conjunctiva/sclera: Conjunctivae normal.  Neck:     Thyroid: No thyromegaly.     Vascular: No carotid bruit or JVD.  Cardiovascular:     Rate and Rhythm: Normal rate and regular rhythm.     Heart sounds: Normal heart sounds. No murmur.  Pulmonary:     Effort: Pulmonary effort is normal. No respiratory distress.     Breath sounds: Normal breath sounds. No wheezing or rales.  Chest:     Chest wall: No tenderness.  Musculoskeletal:     Cervical back: Normal range of motion and neck supple.  Neurological:     Mental Status: She is alert and oriented to person, place, and time.    BP 96/70 (BP Location: Right Arm, Patient Position: Sitting, Cuff Size: Normal)   Pulse 94   Temp (!) 97.4 F (36.3 C) (Temporal)   Resp 18   Ht 5\' 5"  (1.651 m)   Wt 158 lb 6.4 oz (71.8 kg)   LMP 02/08/2019   SpO2 99%   BMI 26.36 kg/m  Wt Readings from Last 3 Encounters:  02/18/19 158 lb 6.4 oz (71.8 kg)  10/05/18 159 lb 11.2 oz (72.4 kg)  03/15/18 150 lb (68 kg)     Lab Results  Component Value Date   WBC 6.6 12/17/2015   HGB 11.9 (L) 12/17/2015   HCT 35.3 (L) 12/17/2015   PLT 265.0 12/17/2015   GLUCOSE 82 12/17/2015   ALT 10 12/17/2015   AST 14 12/17/2015   NA 141 12/17/2015   K 3.9 12/17/2015  CL 107 12/17/2015   CREATININE 0.80 12/17/2015   BUN 12 12/17/2015   CO2 27 12/17/2015    DG INJECT DIAG/THERA/INC NEEDLE/CATH/PLC EPI/LUMB/SAC W/IMG  Result Date: 10/22/2018 CLINICAL DATA:  Low back and left leg pain. Left L5 radiculopathy. Displacement of the L4-5 lumbar disc. Transitional anatomy. I am using the same numbering system as on the MRI of 03/29/2018. FLUOROSCOPY TIME:  Radiation Exposure Index (as provided by the fluoroscopic device): 9.45 uGy*m2 PROCEDURE: The procedure, risks, benefits, and alternatives were explained to the patient.  Questions regarding the procedure were encouraged and answered. The patient understands and consents to the procedure. LUMBAR EPIDURAL INJECTION: An interlaminar approach was performed on left at L4-5. The overlying skin was cleansed and anesthetized. A 20 gauge epidural needle was advanced using loss-of-resistance technique. DIAGNOSTIC EPIDURAL INJECTION: Injection of Isovue-M 200 shows a good epidural pattern with spread above and below the level of needle placement, primarily on the left no vascular opacification is seen. THERAPEUTIC EPIDURAL INJECTION: 120 mg of Depo-Medrol mixed with 3 mL 1% lidocaine were instilled. The procedure was well-tolerated, and the patient was discharged thirty minutes following the injection in good condition. COMPLICATIONS: None IMPRESSION: Technically successful epidural injection on the left L4-5 # 1 Electronically Signed   By: San Morelle M.D.   On: 10/22/2018 15:24     Assessment & Plan:  Plan  I have discontinued Benjamine Mola Vater's tazarotene. I am also having her maintain her norgestimate-ethinyl estradiol, multivitamin with minerals, and clindamycin-benzoyl peroxide.  No orders of the defined types were placed in this encounter.   Problem List Items Addressed This Visit    None    Visit Diagnoses    Need for vaccination    -  Primary   Relevant Orders   Tdap vaccine greater than or equal to 7yo IM (Completed)      Follow-up: No follow-ups on file.  Ann Held, DO

## 2019-02-18 NOTE — Patient Instructions (Signed)
https://www.cdc.gov/vaccines/hcp/vis/vis-statements/tdap.pdf">  Tdap Vaccine (Tetanus, Diphtheria and Pertussis): What You Need to Know 1. Why get vaccinated? Tetanus, diphtheria and pertussis are very serious diseases. Tdap vaccine can protect us from these diseases. And, Tdap vaccine given to pregnant women can protect newborn babies against pertussis.. TETANUS (Lockjaw) is rare in the [place] today. It causes painful muscle tightening and stiffness, usually all over the body.  It can lead to tightening of muscles in the head and neck so you can't open your mouth, swallow, or sometimes even breathe. Tetanus kills about 1 out of 10 people who are infected even after receiving the best medical care. DIPHTHERIA is also rare in the [place] today. It can cause a thick coating to form in the back of the throat.  It can lead to breathing problems, heart failure, paralysis, and death. PERTUSSIS (Whooping Cough) causes severe coughing spells, which can cause difficulty breathing, vomiting and disturbed sleep.  It can also lead to weight loss, incontinence, and rib fractures. Up to 2 in 100 adolescents and 5 in 100 adults with pertussis are hospitalized or have complications, which could include pneumonia or death. These diseases are caused by bacteria. Diphtheria and pertussis are spread from person to person through secretions from coughing or sneezing. Tetanus enters the body through cuts, scratches, or wounds. Before vaccines, as many as 200,000 cases of diphtheria, 200,000 cases of pertussis, and hundreds of cases of tetanus, were reported in the [place] each year. Since vaccination began, reports of cases for tetanus and diphtheria have dropped by about 99% and for pertussis by about 80%. 2. Tdap vaccine Tdap vaccine can protect adolescents and adults from tetanus, diphtheria, and pertussis. One dose of Tdap is routinely given at age 11 or 12. People who did not get Tdap at that age  should get it as soon as possible. Tdap is especially important for healthcare professionals and anyone having close contact with a baby younger than 12 months. Pregnant women should get a dose of Tdap during every pregnancy, to protect the newborn from pertussis. Infants are most at risk for severe, life-threatening complications from pertussis. Another vaccine, called Td, protects against tetanus and diphtheria, but not pertussis. A Td booster should be given every 10 years. Tdap may be given as one of these boosters if you have never gotten Tdap before. Tdap may also be given after a severe cut or burn to prevent tetanus infection. Your doctor or the person giving you the vaccine can give you more information. Tdap may safely be given at the same time as other vaccines. 3. Some people should not get this vaccine  A person who has ever had a life-threatening allergic reaction after a previous dose of any diphtheria, tetanus or pertussis containing vaccine, OR has a severe allergy to any part of this vaccine, should not get Tdap vaccine. Tell the person giving the vaccine about any severe allergies.  Anyone who had coma or long repeated seizures within 7 days after a childhood dose of DTP or DTaP, or a previous dose of Tdap, should not get Tdap, unless a cause other than the vaccine was found. They can still get Td.  Talk to your doctor if you: ? have seizures or another nervous system problem, ? had severe pain or swelling after any vaccine containing diphtheria, tetanus or pertussis, ? ever had a condition called Guillain-Barr Syndrome (GBS), ? aren't feeling well on the day the shot is scheduled. 4. Risks With any medicine, including vaccines,   there is a chance of side effects. These are usually mild and go away on their own. Serious reactions are also possible but are rare. Most people who get Tdap vaccine do not have any problems with it. Mild problems following Tdap (Did not interfere  with activities)  Pain where the shot was given (about 3 in 4 adolescents or 2 in 3 adults)  Redness or swelling where the shot was given (about 1 person in 5)  Mild fever of at least 100.4F (up to about 1 in 25 adolescents or 1 in 100 adults)  Headache (about 3 or 4 people in 10)  Tiredness (about 1 person in 3 or 4)  Nausea, vomiting, diarrhea, stomach ache (up to 1 in 4 adolescents or 1 in 10 adults)  Chills, sore joints (about 1 person in 10)  Body aches (about 1 person in 3 or 4)  Rash, swollen glands (uncommon) Moderate problems following Tdap (Interfered with activities, but did not require medical attention)  Pain where the shot was given (up to 1 in 5 or 6)  Redness or swelling where the shot was given (up to about 1 in 16 adolescents or 1 in 12 adults)  Fever over 102F (about 1 in 100 adolescents or 1 in 250 adults)  Headache (about 1 in 7 adolescents or 1 in 10 adults)  Nausea, vomiting, diarrhea, stomach ache (up to 1 or 3 people in 100)  Swelling of the entire arm where the shot was given (up to about 1 in 500). Severe problems following Tdap (Unable to perform usual activities; required medical attention)  Swelling, severe pain, bleeding and redness in the arm where the shot was given (rare). Problems that could happen after any vaccine:  People sometimes faint after a medical procedure, including vaccination. Sitting or lying down for about 15 minutes can help prevent fainting, and injuries caused by a fall. Tell your doctor if you feel dizzy, or have vision changes or ringing in the ears.  Some people get severe pain in the shoulder and have difficulty moving the arm where a shot was given. This happens very rarely.  Any medication can cause a severe allergic reaction. Such reactions from a vaccine are very rare, estimated at fewer than 1 in a million doses, and would happen within a few minutes to a few hours after the vaccination. As with any medicine,  there is a very remote chance of a vaccine causing a serious injury or death. The safety of vaccines is always being monitored. For more information, visit: www.cdc.gov/vaccinesafety/ 5. What if there is a serious problem? What should I look for?  Look for anything that concerns you, such as signs of a severe allergic reaction, very high fever, or unusual behavior. Signs of a severe allergic reaction can include hives, swelling of the face and throat, difficulty breathing, a fast heartbeat, dizziness, and weakness. These would usually start a few minutes to a few hours after the vaccination. What should I do?  If you think it is a severe allergic reaction or other emergency that can't wait, call 9-1-1 or get the person to the nearest hospital. Otherwise, call your doctor.  Afterward, the reaction should be reported to the Vaccine Adverse Event Reporting System (VAERS). Your doctor might file this report, or you can do it yourself through the VAERS web site at www.vaers.hhs.gov, or by calling 1-800-822-7967. VAERS does not give medical advice. 6. The National Vaccine Injury Compensation Program The National Vaccine Injury Compensation Program (  VICP) is a federal program that was created to compensate people who may have been injured by certain vaccines. Persons who believe they may have been injured by a vaccine can learn about the program and about filing a claim by calling 1-800-338-2382 or visiting the VICP website at www.hrsa.gov/vaccinecompensation. There is a time limit to file a claim for compensation. 7. How can I learn more?  Ask your doctor. He or she can give you the vaccine package insert or suggest other sources of information.  Call your local or state health department.  Contact the Centers for Disease Control and Prevention (CDC): ? Call 1-800-232-4636 (1-800-CDC-INFO) or ? Visit CDC's website at www.cdc.gov/vaccines Vaccine Information Statement Tdap Vaccine (05/03/2013) This  information is not intended to replace advice given to you by your health care provider. Make sure you discuss any questions you have with your health care provider. Document Released: 08/26/2011 Document Revised: 10/12/2017 Document Reviewed: 10/12/2017 Elsevier Interactive Patient Education  2020 Elsevier Inc.  

## 2019-04-04 ENCOUNTER — Telehealth: Payer: Self-pay

## 2019-04-04 NOTE — Telephone Encounter (Signed)
Patient called in needing  Dr. Lendell Caprice to sign a Clearance form for one of her classes in college. Can someone follow back up with the patient at [phone] thanks.

## 2019-04-07 NOTE — Telephone Encounter (Signed)
Lvm again today for patient to call us back id she still needs a note from [place]

## 2019-04-19 ENCOUNTER — Encounter: Payer: Self-pay | Admitting: Women's Health

## 2019-04-19 ENCOUNTER — Ambulatory Visit (INDEPENDENT_AMBULATORY_CARE_PROVIDER_SITE_OTHER): Payer: No Typology Code available for payment source | Admitting: Women's Health

## 2019-04-19 ENCOUNTER — Other Ambulatory Visit: Payer: Self-pay

## 2019-04-19 VITALS — BP 120/78 | Ht 65.0 in | Wt 160.0 lb

## 2019-04-19 DIAGNOSIS — B373 Candidiasis of vulva and vagina: Secondary | ICD-10-CM

## 2019-04-19 DIAGNOSIS — Z01419 Encounter for gynecological examination (general) (routine) without abnormal findings: Secondary | ICD-10-CM

## 2019-04-19 DIAGNOSIS — Z3041 Encounter for surveillance of contraceptive pills: Secondary | ICD-10-CM | POA: Diagnosis not present

## 2019-04-19 DIAGNOSIS — Z113 Encounter for screening for infections with a predominantly sexual mode of transmission: Secondary | ICD-10-CM

## 2019-04-19 DIAGNOSIS — B3731 Acute candidiasis of vulva and vagina: Secondary | ICD-10-CM

## 2019-04-19 LAB — HM PAP SMEAR

## 2019-04-19 LAB — WET PREP FOR TRICH, YEAST, CLUE

## 2019-04-19 MED ORDER — NORGESTIMATE-ETH ESTRADIOL 0.25-35 MG-MCG PO TABS: 1.0000 | ORAL_TABLET | Freq: Every day | ORAL | 4 refills | Status: DC

## 2019-04-19 MED ORDER — FLUCONAZOLE 150 MG PO TABS: 150.0000 mg | ORAL_TABLET | Freq: Once | ORAL | 1 refills | Status: AC

## 2019-04-19 MED FILL — FLUCONAZOLE 150 MG TABS: 150 | 1 days supply | Qty: 1 | Fill #0

## 2019-04-19 NOTE — Patient Instructions (Addendum)
It was nice meeting you today Multivitamin daily [facility]  [phone]  For mom for mammogram Vaginal Yeast Infection, Adult  Vaginal yeast infection is a condition that causes vaginal discharge as well as soreness, swelling, and redness (inflammation) of the vagina. This is a common condition. Some women get this infection frequently. What are the causes? This condition is caused by a change in the normal balance of the yeast (candida) and bacteria that live in the vagina. This change causes an overgrowth of yeast, which causes the inflammation. What increases the risk? The condition is more likely to develop in women who:  Take antibiotic medicines.  Have diabetes.  Take birth control pills.  Are pregnant.  Douche often.  Have a weak body defense system (immune system).  Have been taking steroid medicines for a long time.  Frequently wear tight clothing. What are the signs or symptoms? Symptoms of this condition include:  White, thick, creamy vaginal discharge.  Swelling, itching, redness, and irritation of the vagina. The lips of the vagina (vulva) may be affected as well.  Pain or a burning feeling while urinating.  Pain during sex. How is this diagnosed? This condition is diagnosed based on:  Your medical history.  A physical exam.  A pelvic exam. Your health care provider will examine a sample of your vaginal discharge under a microscope. Your health care provider may send this sample for testing to confirm the diagnosis. How is this treated? This condition is treated with medicine. Medicines may be over-the-counter or prescription. You may be told to use one or more of the following:  Medicine that is taken by mouth (orally).  Medicine that is applied as a cream (topically).  Medicine that is inserted directly into the vagina (suppository). Follow these instructions at home:  Lifestyle  Do not have sex until your health care provider approves. Tell  your sex partner that you have a yeast infection. That person should go to his or her health care provider and ask if they should also be treated.  Do not wear tight clothes, such as pantyhose or tight pants.  Wear breathable cotton underwear. General instructions  Take or apply over-the-counter and prescription medicines only as told by your health care provider.  Eat more yogurt. This may help to keep your yeast infection from returning.  Do not use tampons until your health care provider approves.  Try taking a sitz bath to help with discomfort. This is a warm water bath that is taken while you are sitting down. The water should only come up to your hips and should cover your buttocks. Do this 3-4 times per day or as told by your health care provider.  Do not douche.  If you have diabetes, keep your blood sugar levels under control.  Keep all follow-up visits as told by your health care provider. This is important. Contact a health care provider if:  You have a fever.  Your symptoms go away and then return.  Your symptoms do not get better with treatment.  Your symptoms get worse.  You have new symptoms.  You develop blisters in or around your vagina.  You have blood coming from your vagina and it is not your menstrual period.  You develop pain in your abdomen. Summary  Vaginal yeast infection is a condition that causes discharge as well as soreness, swelling, and redness (inflammation) of the vagina.  This condition is treated with medicine. Medicines may be over-the-counter or prescription.  Take or  apply over-the-counter and prescription medicines only as told by your health care provider.  Do not douche. Do not have sex or use tampons until your health care provider approves.  Contact a health care provider if your symptoms do not get better with treatment or your symptoms go away and then return. This information is not intended to replace advice given to you  by your health care provider. Make sure you discuss any questions you have with your health care provider. Document Revised: 09/24/2018 Document Reviewed: 07/13/2017 Elsevier Patient Education  2020 [employer].  Health Maintenance, Female Adopting a healthy lifestyle and getting preventive care are important in promoting health and wellness. Ask your health care provider about:  The right schedule for you to have regular tests and exams.  Things you can do on your own to prevent diseases and keep yourself healthy. What should I know about diet, weight, and exercise? Eat a healthy diet   Eat a diet that includes plenty of vegetables, fruits, low-fat dairy products, and lean protein.  Do not eat a lot of foods that are high in solid fats, added sugars, or sodium. Maintain a healthy weight Body mass index (BMI) is used to identify weight problems. It estimates body fat based on height and weight. Your health care provider can help determine your BMI and help you achieve or maintain a healthy weight. Get regular exercise Get regular exercise. This is one of the most important things you can do for your health. Most adults should:  Exercise for at least 150 minutes each week. The exercise should increase your heart rate and make you sweat (moderate-intensity exercise).  Do strengthening exercises at least twice a week. This is in addition to the moderate-intensity exercise.  Spend less time sitting. Even light physical activity can be beneficial. Watch cholesterol and blood lipids Have your blood tested for lipids and cholesterol at 22 years of age, then have this test every 5 years. Have your cholesterol levels checked more often if:  Your lipid or cholesterol levels are high.  You are older than 22 years of age.  You are at high risk for heart disease. What should I know about cancer screening? Depending on your health history and family history, you may need to have cancer  screening at various ages. This may include screening for:  Breast cancer.  Cervical cancer.  Colorectal cancer.  Skin cancer.  Lung cancer. What should I know about heart disease, diabetes, and high blood pressure? Blood pressure and heart disease  High blood pressure causes heart disease and increases the risk of stroke. This is more likely to develop in people who have high blood pressure readings, are of African descent, or are overweight.  Have your blood pressure checked: ? Every 3-5 years if you are 29-20 years of age. ? Every year if you are 37 years old or older. Diabetes Have regular diabetes screenings. This checks your fasting blood sugar level. Have the screening done:  Once every three years after age 29 if you are at a normal weight and have a low risk for diabetes.  More often and at a younger age if you are overweight or have a high risk for diabetes. What should I know about preventing infection? Hepatitis B If you have a higher risk for hepatitis B, you should be screened for this virus. Talk with your health care provider to find out if you are at risk for hepatitis B infection. Hepatitis C Testing is recommended  for:  Everyone born from [birth year] through 1963.  Anyone with known risk factors for hepatitis C. Sexually transmitted infections (STIs)  Get screened for STIs, including gonorrhea and chlamydia, if: ? You are sexually active and are younger than 22 years of age. ? You are older than 22 years of age and your health care provider tells you that you are at risk for this type of infection. ? Your sexual activity has changed since you were last screened, and you are at increased risk for chlamydia or gonorrhea. Ask your health care provider if you are at risk.  Ask your health care provider about whether you are at high risk for HIV. Your health care provider may recommend a prescription medicine to help prevent HIV infection. If you choose to take  medicine to prevent HIV, you should first get tested for HIV. You should then be tested every 3 months for as long as you are taking the medicine. Pregnancy  If you are about to stop having your period (premenopausal) and you may become pregnant, seek counseling before you get pregnant.  Take 400 to 800 micrograms (mcg) of folic acid every day if you become pregnant.  Ask for birth control (contraception) if you want to prevent pregnancy. Osteoporosis and menopause Osteoporosis is a disease in which the bones lose minerals and strength with aging. This can result in bone fractures. If you are 16 years old or older, or if you are at risk for osteoporosis and fractures, ask your health care provider if you should:  Be screened for bone loss.  Take a calcium or vitamin D supplement to lower your risk of fractures.  Be given hormone replacement therapy (HRT) to treat symptoms of menopause. Follow these instructions at home: Lifestyle  Do not use any products that contain nicotine or tobacco, such as cigarettes, e-cigarettes, and chewing tobacco. If you need help quitting, ask your health care provider.  Do not use street drugs.  Do not share needles.  Ask your health care provider for help if you need support or information about quitting drugs. Alcohol use  Do not drink alcohol if: ? Your health care provider tells you not to drink. ? You are pregnant, may be pregnant, or are planning to become pregnant.  If you drink alcohol: ? Limit how much you use to 0-1 drink a day. ? Limit intake if you are breastfeeding.  Be aware of how much alcohol is in your drink. In the [place] one drink equals one 12 oz bottle of beer (355 mL), one 5 oz glass of wine (148 mL), or one 1 oz glass of hard liquor (44 mL). General instructions  Schedule regular health, dental, and eye exams.  Stay current with your vaccines.  Tell your health care provider if: ? You often feel depressed. ? You have  ever been abused or do not feel safe at home. Summary  Adopting a healthy lifestyle and getting preventive care are important in promoting health and wellness.  Follow your health care provider's instructions about healthy diet, exercising, and getting tested or screened for diseases.  Follow your health care provider's instructions on monitoring your cholesterol and blood pressure. This information is not intended to replace advice given to you by your health care provider. Make sure you discuss any questions you have with your health care provider. Document Revised: 02/17/2018 Document Reviewed: 02/17/2018 Elsevier Patient Education  2020 [employer].

## 2019-04-19 NOTE — Addendum Note (Signed)
Addended by: Tito Dine on: 04/19/2019 03:58 PM   Modules accepted: Orders

## 2019-04-19 NOTE — Progress Notes (Signed)
Belinda Bryan [birth year] [id number]    History:    Presents for annual exam.  Monthly cycle on Sprintec without complaint.  Having vaginal discharge with itching has used over-the-counter vagsil with some relief.  New partner/Marine in the past year.  Received 1 Gardasil, arm pain and numbness for 3 weeks after injections and did not complete the series.  Had Tdap 02/2019.  History of mild right ear hearing loss.  Past medical history, past surgical history, family history and social history were all reviewed and documented in the EPIC chart.  Student at [employer] hoping to go to occupational therapy school.  Mother works at home.  Parents healthy.  ROS:  A ROS was performed and pertinent positives and negatives are included.  Exam:  Vitals:   04/19/19 1510  BP: 120/78  Weight: 160 lb (72.6 kg)  Height: 5\' 5"  (1.651 m)   Body mass index is 26.63 kg/m.   General appearance:  Normal mild scoliosis Thyroid:  Symmetrical, normal in size, without palpable masses or nodularity. Respiratory  Auscultation:  Clear without wheezing or rhonchi Cardiovascular  Auscultation:  Regular rate, without rubs, murmurs or gallops  Edema/varicosities:  Not grossly evident Abdominal  Soft,nontender, without masses, guarding or rebound.  Liver/spleen:  No organomegaly noted  Hernia:  None appreciated  Skin  Inspection:  Grossly normal   Breasts: Examined lying and sitting.     Right: Without masses, retractions, discharge or axillary adenopathy.     Left: Without masses, retractions, discharge or axillary adenopathy. Gentitourinary   Inguinal/mons:  Normal without inguinal adenopathy  External genitalia:  Normal  BUS/Urethra/Skene's glands:  Normal  Vagina: Moderate amount of white curdy discharge wet prep positive for yeast   Cervix:  Normal  Uterus:  normal in size, shape and contour.  Midline and mobile  Adnexa/parametria:     Rt: Without masses or tenderness.   Lt: Without masses or  tenderness.  Anus and perineum: Normal   Assessment/Plan:  24 y.o. S WF G0 for new patient annual exam with complaint of vaginal itching and discharge.  Monthly cycle on Sprintec Yeast vaginitis STD screen  Plan: Sprintec prescription, proper use, slight risk for blood clots and strokes reviewed.  Condoms encouraged until permanent partner.  Diflucan 150 p.o. x1 dose with refill.  Instructed to call if continued problems.  SBEs, exercise, calcium rich foods, MVI daily encouraged.  Campus and driving safety discussed.. Gardasil discussed declines.  CBC, GC/chlamydia, HIV, RPR.  Pap.  The screening guidelines reviewed.    11-29[birth year]1983 [facility], 3:26 PM 04/19/2019

## 2019-04-20 LAB — URINALYSIS, COMPLETE W/RFL CULTURE
Bacteria, UA: NONE SEEN /HPF
Bilirubin Urine: NEGATIVE
Glucose, UA: NEGATIVE
Hgb urine dipstick: NEGATIVE
Hyaline Cast: NONE SEEN /LPF
Ketones, ur: NEGATIVE
Leukocyte Esterase: NEGATIVE
Nitrites, Initial: NEGATIVE
Protein, ur: NEGATIVE
RBC / HPF: NONE SEEN /HPF (ref 0–2)
Specific Gravity, Urine: 1.024 (ref 1.001–1.03)
WBC, UA: NONE SEEN /HPF (ref 0–5)
pH: 5.5 (ref 5.0–8.0)

## 2019-04-20 LAB — NO CULTURE INDICATED

## 2019-04-22 LAB — PAP THINPREP ASCUS RFLX HPV RFLX TYPE
C. trachomatis RNA, TMA: NOT DETECTED
N. gonorrhoeae RNA, TMA: NOT DETECTED

## 2019-05-05 MED FILL — PREVIFEM 0.25-35 MG-MCG TAB: 0.25-35 | 84 days supply | Qty: 84 | Fill #2

## 2019-05-10 MED FILL — FLUCONAZOLE 150 MG TABS: 150 | 1 days supply | Qty: 1 | Fill #1

## 2019-08-02 MED FILL — VYLIBRA 0.25-35 MG-MCG TABS: 0.25-35 | 84 days supply | Qty: 84 | Fill #3

## 2019-10-10 MED FILL — VYLIBRA 0.25-35 MG-MCG TABS: 0.25-35 | 84 days supply | Qty: 84 | Fill #0

## 2019-10-12 ENCOUNTER — Ambulatory Visit (INDEPENDENT_AMBULATORY_CARE_PROVIDER_SITE_OTHER): Payer: No Typology Code available for payment source | Admitting: Family Medicine

## 2019-10-12 ENCOUNTER — Encounter: Payer: Self-pay | Admitting: Family Medicine

## 2019-10-12 ENCOUNTER — Other Ambulatory Visit: Payer: Self-pay

## 2019-10-12 VITALS — BP 110/64 | HR 74 | Temp 98.5°F | Ht 65.0 in | Wt 156.0 lb

## 2019-10-12 DIAGNOSIS — R002 Palpitations: Secondary | ICD-10-CM | POA: Diagnosis not present

## 2019-10-12 DIAGNOSIS — M25552 Pain in left hip: Secondary | ICD-10-CM

## 2019-10-12 LAB — COMPREHENSIVE METABOLIC PANEL
ALT: 12 U/L (ref 0–35)
AST: 16 U/L (ref 0–37)
Albumin: 4.4 g/dL (ref 3.5–5.2)
Alkaline Phosphatase: 94 U/L (ref 39–117)
BUN: 14 mg/dL (ref 6–23)
CO2: 29 mEq/L (ref 19–32)
Calcium: 9.6 mg/dL (ref 8.4–10.5)
Chloride: 102 mEq/L (ref 96–112)
Creatinine, Ser: 0.86 mg/dL (ref 0.40–1.20)
GFR: 82.38 mL/min (ref >60.00)
Glucose, Bld: 83 mg/dL (ref 70–99)
Potassium: 4.5 mEq/L (ref 3.5–5.1)
Sodium: 138 mEq/L (ref 135–145)
Total Bilirubin: 0.4 mg/dL (ref 0.2–1.2)
Total Protein: 7.4 g/dL (ref 6.0–8.3)

## 2019-10-12 LAB — T4, FREE: Free T4: 0.98 ng/dL (ref 0.60–1.60)

## 2019-10-12 LAB — CBC
HCT: 40.8 % (ref 36.0–46.0)
Hemoglobin: 13.8 g/dL (ref 12.0–15.0)
MCHC: 34 g/dL (ref 30.0–36.0)
MCV: 94.1 fl (ref 78.0–100.0)
Platelets: 268 10*3/uL (ref 150.0–400.0)
RBC: 4.33 Mil/uL (ref 3.87–5.11)
RDW: 12.1 % (ref 11.5–15.5)
WBC: 7.9 10*3/uL (ref 4.0–10.5)

## 2019-10-12 LAB — TSH: TSH: 1.2 u[IU]/mL (ref 0.35–4.50)

## 2019-10-12 LAB — MAGNESIUM: Magnesium: 1.8 mg/dL (ref 1.5–2.5)

## 2019-10-12 NOTE — Patient Instructions (Signed)
Give [place] 2-3 business days to get the results of your labs back.   Stay hydrated- try to get in 60 oz of water daily outside of exercise.  Mind caffeine and alcohol intake.  If you do not hear anything about your referrals in the next 1-2 weeks, call our office and ask for an update.  Let us know if you need anything.

## 2019-10-12 NOTE — Progress Notes (Signed)
Chief Complaint  Patient presents with  . Follow-up    heart flutter   . Hip Pain    Referral focus plan to ortho    Belinda Bryan is a 24 y.o. female here for palpitations. Here w mom, Belinda Bryan.   Length of issue: 3 months How long does it last: 30s-3 min  Happened 7x over past 2 mo.  Light headedness/passing out? Yes- dizzy, no loc Chest pain/shortness of breath? Yes while exercising  Hx of arrhythmia? No Medication changes/illicit substances? No  Drinks around 32 oz water daily.   Needs a referral to ortho for [name] that she has been dealing with.  Past Medical History:  Diagnosis Date  . Acne 07/16/2014  . Lumbar scoliosis 12/14/2017  . Thoracic outlet syndrome 05/17/2014   Past Surgical History:  Procedure Laterality Date  . THORACIC OUTLET SURGERY     No Known Allergies   BP 110/64 (BP Location: Left Arm, Patient Position: Sitting, Cuff Size: Normal)   Pulse 74   Temp 98.5 F (36.9 C) (Oral)   Ht 5\' 5"  (1.651 m)   Wt 156 lb (70.8 kg)   SpO2 98%   BMI 25.96 kg/m  Gen: Awake, alert, appearing stated age Eyes: PERRLA Mouth: MMM Heart: RRR, no bruits Lungs: CTAB, no accessory muscle use Neuro: No cerebellar signs MSK: No muscle group atrophy or asymmetry Psych: Age appropriate judgment and insight, mood/affect WNL  Palpitations - Plan: CBC, Comprehensive metabolic panel, TSH, T4, free, Magnesium, Ambulatory referral to Cardiology  Left hip pain - Plan: Ambulatory referral to Orthopedic Surgery  Discussed EKG and how it likely would not catch anything at this time. Declined after discussion. Ck labs. Refer cards. Stay hydrated. Mind caffeine/alcohol intake.  Fu prn.  The patient voiced understanding and agreement to the plan.  Zalan Shidler 10:56 AM 10/12/19

## 2019-10-21 ENCOUNTER — Telehealth: Payer: Self-pay | Admitting: Family

## 2019-10-21 NOTE — Telephone Encounter (Signed)
CallerKimie Bryan Call back # [phone]  Patient states cardiology referral was worded incorrectly.

## 2019-10-24 NOTE — Telephone Encounter (Signed)
This message was routed to me. Patient called in stating her referral was worded wrong to get paid by insurance.

## 2019-10-24 NOTE — Telephone Encounter (Signed)
Referral was fixed, message left with patient's mother for her to be aware.

## 2019-10-24 NOTE — Telephone Encounter (Signed)
Per patient she was advised by heart care "something on the referral needs to be change in order for them to schedule her appointment". Belinda Bryan will try to get more information from heart care.

## 2019-11-25 ENCOUNTER — Telehealth: Payer: No Typology Code available for payment source | Admitting: Emergency Medicine

## 2019-11-25 DIAGNOSIS — N760 Acute vaginitis: Secondary | ICD-10-CM

## 2019-11-25 DIAGNOSIS — R300 Dysuria: Secondary | ICD-10-CM

## 2019-11-25 MED ORDER — NITROFURANTOIN MONOHYD MACRO 100 MG PO CAPS: 100.0000 mg | ORAL_CAPSULE | Freq: Two times a day (BID) | ORAL | 0 refills | Status: DC

## 2019-11-25 MED ORDER — FLUCONAZOLE 150 MG PO TABS: 150.0000 mg | ORAL_TABLET | Freq: Once | ORAL | 0 refills | Status: AC

## 2019-11-25 NOTE — Progress Notes (Signed)
We are sorry that you are not feeling well.  Here is how we plan to help!  Based on what you shared with me it looks like you most likely have a simple urinary tract infection.  A UTI (Urinary Tract Infection) is a bacterial infection of the bladder.  Most cases of urinary tract infections are simple to treat but a key part of your care is to encourage you to drink plenty of fluids and watch your symptoms carefully.  I have prescribed MacroBid 100 mg twice a day for 5 days.  Your symptoms should gradually improve. Call us if the burning in your urine worsens, you develop worsening fever, back pain or pelvic pain or if your symptoms do not resolve after completing the antibiotic.  For Your vaginal symptoms I have prescribed a single dose of Diflucan 150 mg tablet by mouth once.  Urinary tract infections can be prevented by drinking plenty of water to keep your body hydrated.  Also be sure when you wipe, wipe from front to back and don't hold it in!  If possible, empty your bladder every 4 hours.  Your e-visit answers were reviewed by a board certified advanced clinical practitioner to complete your personal care plan.  Depending on the condition, your plan could have included both over the counter or prescription medications.  If there is a problem please reply  once you have received a response from your provider.  Your safety is important to [place].  If you have drug allergies check your prescription carefully.    You can use MyChart to ask questions about todays visit, request a non-urgent call back, or ask for a work or school excuse for 24 hours related to this e-Visit. If it has been greater than 24 hours you will need to follow up with your provider, or enter a new e-Visit to address those concerns.   You will get an e-mail in the next two days asking about your experience.  I hope that your e-visit has been valuable and will speed your recovery. Thank you for using e-visits.   **Please  do not respond to this message unless you have follow up questions.** Greater than 5 but less than 10 minutes spent researching, coordinating, and implementing care for this patient today

## 2019-12-01 ENCOUNTER — Ambulatory Visit (INDEPENDENT_AMBULATORY_CARE_PROVIDER_SITE_OTHER): Payer: No Typology Code available for payment source | Admitting: Orthopaedic Surgery

## 2019-12-01 ENCOUNTER — Other Ambulatory Visit: Payer: Self-pay

## 2019-12-01 DIAGNOSIS — M25552 Pain in left hip: Secondary | ICD-10-CM | POA: Diagnosis not present

## 2019-12-01 MED ORDER — LIDOCAINE HCL 1 % IJ SOLN
3.0000 mL | INTRAMUSCULAR | Status: AC | PRN
  Administered 2019-12-01: 3 mL

## 2019-12-01 MED ORDER — METHYLPREDNISOLONE ACETATE 40 MG/ML IJ SUSP
40.0000 mg | INTRAMUSCULAR | Status: AC | PRN
  Administered 2019-12-01: 40 mg via INTRA_ARTICULAR

## 2019-12-01 NOTE — Progress Notes (Signed)
Office Visit Note   Patient: Belinda Bryan           Date of Birth: September 10, 1995           MRN: [id number] Visit Date: 12/01/2019              Requested by: Sharlene Dory, DO [address] [place],  [place] [address] PCP: Sandford Craze, NP   Assessment & Plan: Visit Diagnoses:  1. Pain in left hip     Plan: Given the chronicity of her pain, a MRI of the left hip is warranted at this standpoint to assess the tenderness and ligament structures around the left hip.  Also provided a steroid injection in the point of maximum tenderness to see how this would help with the pain she is having over this area.  All question concerns were answered and addressed.  We will see her back after the MRI of her left hip.  Follow-Up Instructions: Return in about 2 weeks (around 12/15/2019).   Orders:  Orders Placed This Encounter  Procedures  . Large Joint Inj   No orders of the defined types were placed in this encounter.     Procedures: Large Joint Inj: L greater trochanter on 12/01/2019 2:04 PM Indications: pain and diagnostic evaluation Details: 22 G 1.5 in needle, lateral approach  Arthrogram: No  Medications: 3 mL lidocaine 1 %; 40 mg methylPREDNISolone acetate 40 MG/ML Outcome: tolerated well, no immediate complications Procedure, treatment alternatives, risks and benefits explained, specific risks discussed. Consent was given by the patient. Immediately prior to procedure a time out was called to verify the correct patient, procedure, equipment, support staff and site/side marked as required. Patient was prepped and draped in the usual sterile fashion.       Clinical Data: No additional findings.   Subjective: Chief Complaint  Patient presents with  . Left Hip - Pain  . Left Leg - Pain  Patient comes in today for evaluation treatment of left hip pain is been going on for about 4 years now.  This is been worked up before but without any type of  MRI.  She has been through physical therapy she is never had injection around her hip.  She denies any groin pain.  She has a history of playing sand volleyball when she stepped in a hole and she has had pain ever since then.  It sometimes has been in the buttocks area.  She had MRI of her lumbar spine and she does have some lumbar scoliosis.  She is even had an epidural steroid injection.  She is otherwise a athletic individual but any type of weightlifting activities as well as treadmill, running and elliptical can cause pain in her left hip.  She is not a side sleeper.  She is not a diabetic and is a very healthy individual otherwise.  There are x-rays on the canopy system for me to review of her pelvis and hip.  HPI  Review of Systems She currently denies a headache, chest pain, shortness of breath, fever, chills, nausea, vomiting  Objective: Vital Signs: There were no vitals taken for this visit.  Physical Exam She is alert and orient x3 and in no acute distress Ortho Exam Examination of her left hip does show pain to palpation along the IT band as it comes up to the proximal femur and over the trochanteric area.  The remainder of her hip exam is entirely normal.  There is some  pain over the tip of the trochanteric area as well.  Range of motion is smooth and full with no pain in the groin at all. Specialty Comments:  No specialty comments available.  Imaging: No results found. X-rays of the pelvis and hips are normal.  PMFS History: Patient Active Problem List   Diagnosis Date Noted  . Lumbar radiculopathy 03/15/2018  . Trochanteric bursitis of left hip 03/15/2018  . 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 Lumbar scoliosis 12/14/2017  . Acne 07/16/2014  . Decreased hearing of right ear 07/16/2014  . Thoracic outlet syndrome 05/17/2014   Past Medical History:  Diagnosis Date  . Acne 07/16/2014  . Lumbar scoliosis 12/14/2017  . Thoracic outlet syndrome 05/17/2014    Family  History  Problem Relation Age of Onset  . Hypertension Maternal Grandmother   . Hyperlipidemia Maternal Grandmother   . COPD Paternal Grandmother   . Heart block Paternal Grandmother   . Hyperlipidemia Paternal Grandfather   . Sudden death Neg Hx   . Heart attack Neg Hx     Past Surgical History:  Procedure Laterality Date  . THORACIC OUTLET SURGERY     Social History   Occupational History  . Occupation: Consulting civil engineer  Tobacco Use  . Smoking status: Never Smoker  . Smokeless tobacco: Never Used  Vaping Use  . Vaping Use: Never used  Substance and Sexual Activity  . Alcohol use: Yes    Alcohol/week: 1.0 standard drink    Types: 1 Shots of liquor per week  . Drug use: No  . Sexual activity: Yes    Partners: Male    Birth control/protection: Condom, Pill

## 2019-12-02 ENCOUNTER — Ambulatory Visit (INDEPENDENT_AMBULATORY_CARE_PROVIDER_SITE_OTHER): Payer: No Typology Code available for payment source

## 2019-12-02 ENCOUNTER — Other Ambulatory Visit: Payer: Self-pay

## 2019-12-02 ENCOUNTER — Encounter: Payer: Self-pay | Admitting: Cardiology

## 2019-12-02 ENCOUNTER — Ambulatory Visit (INDEPENDENT_AMBULATORY_CARE_PROVIDER_SITE_OTHER): Payer: No Typology Code available for payment source | Admitting: Cardiology

## 2019-12-02 DIAGNOSIS — R011 Cardiac murmur, unspecified: Secondary | ICD-10-CM | POA: Insufficient documentation

## 2019-12-02 DIAGNOSIS — R002 Palpitations: Secondary | ICD-10-CM

## 2019-12-02 DIAGNOSIS — M25552 Pain in left hip: Secondary | ICD-10-CM

## 2019-12-02 HISTORY — DX: Cardiac murmur, unspecified: R01.1

## 2019-12-02 HISTORY — DX: Palpitations: R00.2

## 2019-12-02 NOTE — Patient Instructions (Signed)
Medication Instructions:  No medication changes. *If you need a refill on your cardiac medications before your next appointment, please call your pharmacy*   Lab Work: None ordered If you have labs (blood work) drawn today and your tests are completely normal, you will receive your results only by: Marland Kitchen MyChart Message (if you have MyChart) OR . A paper copy in the mail If you have any lab test that is abnormal or we need to change your treatment, we will call you to review the results.   Testing/Procedures: Your physician has requested that you have an echocardiogram. Echocardiography is a painless test that uses sound waves to create images of your heart. It provides your doctor with information about the size and shape of your heart and how well your heart's chambers and valves are working. This procedure takes approximately one hour. There are no restrictions for this procedure.   WHY IS MY DOCTOR PRESCRIBING ZIO? The Zio system is proven and trusted by physicians to detect and diagnose irregular heart rhythms -- and has been prescribed to hundreds of thousands of patients.  The FDA has cleared the Zio system to monitor for many different kinds of irregular heart rhythms. In a study, physicians were able to reach a diagnosis 90% of the time with the Zio system1.  You can wear the Zio monitor -- a small, discreet, comfortable patch -- during your normal day-to-day activity, including while you sleep, shower, and exercise, while it records every single heartbeat for analysis.  1Barrett, P., et al. Comparison of 24 Hour Holter Monitoring Versus 14 Day Novel Adhesive Patch Electrocardiographic Monitoring. American Journal of Medicine, 2014.  ZIO VS. HOLTER MONITORING The Zio monitor can be comfortably worn for up to 14 days. Holter monitors can be worn for 24 to 48 hours, limiting the time to record any irregular heart rhythms you may have. Zio is able to capture data for the 51% of patients  who have their first symptom-triggered arrhythmia after 48 hours.1  LIVE WITHOUT RESTRICTIONS The Zio ambulatory cardiac monitor is a small, unobtrusive, and water-resistant patch--you might even forget you're wearing it. The Zio monitor records and stores every beat of your heart, whether you're sleeping, working out, or showering.  Wear the monitor for 2 weeks, remove 12/16/2019.   Follow-Up: At [facility], you and your health needs are our priority.  As part of our continuing mission to provide you with exceptional heart care, we have created designated Provider Care Teams.  These Care Teams include your primary Cardiologist (physician) and Advanced Practice Providers (APPs -  Physician Assistants and Nurse Practitioners) who all work together to provide you with the care you need, when you need it.  We recommend signing up for the patient portal called "MyChart".  Sign up information is provided on this After Visit Summary.  MyChart is used to connect with patients for Virtual Visits (Telemedicine).  Patients are able to view lab/test results, encounter notes, upcoming appointments, etc.  Non-urgent messages can be sent to your provider as well.   To learn more about what you can do with MyChart, go to [email or web address].    Your next appointment:   1 month(s)  The format for your next appointment:   In Person  Provider:   Belva Crome, MD   Other Instructions  Echocardiogram An echocardiogram is a procedure that uses painless sound waves (ultrasound) to produce an image of the heart. Images from an echocardiogram can provide important information about:  Signs of  coronary artery disease (CAD).  Aneurysm detection. An aneurysm is a weak or damaged part of an artery wall that bulges out from the normal force of blood pumping through the body.  Heart size and shape. Changes in the size or shape of the heart can be associated with certain conditions, including heart  failure, aneurysm, and CAD.  Heart muscle function.  Heart valve function.  Signs of a past heart attack.  Fluid buildup around the heart.  Thickening of the heart muscle.  A tumor or infectious growth around the heart valves. Tell a health care provider about:  Any allergies you have.  All medicines you are taking, including vitamins, herbs, eye drops, creams, and over-the-counter medicines.  Any blood disorders you have.  Any surgeries you have had.  Any medical conditions you have.  Whether you are pregnant or may be pregnant. What are the risks? Generally, this is a safe procedure. However, problems may occur, including:  Allergic reaction to dye (contrast) that may be used during the procedure. What happens before the procedure? No specific preparation is needed. You may eat and drink normally. What happens during the procedure?   An IV tube may be inserted into one of your veins.  You may receive contrast through this tube. A contrast is an injection that improves the quality of the pictures from your heart.  A gel will be applied to your chest.  A wand-like tool (transducer) will be moved over your chest. The gel will help to transmit the sound waves from the transducer.  The sound waves will harmlessly bounce off of your heart to allow the heart images to be captured in real-time motion. The images will be recorded on a computer. The procedure may vary among health care providers and hospitals. What happens after the procedure?  You may return to your normal, everyday life, including diet, activities, and medicines, unless your health care provider tells you not to do that. Summary  An echocardiogram is a procedure that uses painless sound waves (ultrasound) to produce an image of the heart.  Images from an echocardiogram can provide important information about the size and shape of your heart, heart muscle function, heart valve function, and fluid buildup  around your heart.  You do not need to do anything to prepare before this procedure. You may eat and drink normally.  After the echocardiogram is completed, you may return to your normal, everyday life, unless your health care provider tells you not to do that. This information is not intended to replace advice given to you by your health care provider. Make sure you discuss any questions you have with your health care provider. Document Revised: 06/17/2018 Document Reviewed: 03/29/2016 Elsevier Patient Education  [employer].

## 2019-12-02 NOTE — Progress Notes (Signed)
Cardiology Office Note:    Date:  12/02/2019   ID:  Belinda Bryan, DOB August 08, 1995, MRN [id number]  PCP:  Sandford Craze, NP  Cardiologist:  Garwin Brothers, MD   Referring MD: Sharlene Dory*    ASSESSMENT:    1. Palpitations   2. Cardiac murmur    PLAN:    In order of problems listed above:  1. Primary prevention stressed with the patient.  Importance of compliance with diet medication stressed and she vocalized understanding.  She is a very active lady and even runs 5K's without any problems. 2. Palpitations: Appears to be fairly benign and not very significant however she is very concerned about it and it affects her quality of life.  Therefore we will do a 2-week monitoring to assess the symptoms. 3. Cardiac murmur: Echocardiogram will be done to assess murmur on auscultation. 4. Patient knows to go to the nearest emergency room for any concerning symptoms. 5. Patient will be seen in follow-up appointment in 6 months or earlier if the patient has any concerns    Medication Adjustments/Labs and Tests Ordered: Current medicines are reviewed at length with the patient today.  Concerns regarding medicines are outlined above.  No orders of the defined types were placed in this encounter.  No orders of the defined types were placed in this encounter.    History of Present Illness:    Belinda Bryan is a 24 y.o. female who is being seen today for the evaluation of palpitations at the request of Sharlene Dory*.  Patient is a pleasant 24 year old female.  She has past medical history of thoracic outlet syndrome she says she has had surgery for this.  She mentions to me that she is having skipped beat sensations and palpitations on and off.  No chest pain orthopnea or PND.  She has had never any dizziness or any syncope.  She is concerned about the symptoms and therefore wants to be evaluated.  I noted that blood work including magnesium and TSH level was  unremarkable.  At the time of my evaluation, the patient is alert awake oriented and in no distress.  Past Medical History:  Diagnosis Date  . Acne 07/16/2014  . Decreased hearing of right ear 07/16/2014  . Left arm weakness 05/17/2014   Formatting of this note might be different from the original. 24 yo female referred to Dr. Glenna Durand by [facility], Dr. Hennie Duos. Willis for thoracic outlet syndrome, left.  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  Extensive workup including MRI of the C-spine, chest "area", arterial doppler  . Lumbar radiculopathy 03/15/2018  . Lumbar scoliosis 12/14/2017  . 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 Thoracic outlet syndrome 05/17/2014  . Trochanteric bursitis of left hip 03/15/2018    Past Surgical History:  Procedure Laterality Date  . THORACIC OUTLET SURGERY      Current Medications: Current Meds  Medication Sig  . acetaminophen (TYLENOL) 500 MG tablet Take 500 mg by mouth as needed.  . Multiple Vitamins-Minerals (MULTIVITAMIN WITH MINERALS) tablet Take 1 tablet by mouth daily.  . nitrofurantoin, macrocrystal-monohydrate, (MACROBID) 100 MG capsule Take 1 capsule (100 mg total) by mouth 2 (two) times daily.  . NON FORMULARY retna cream     Allergies:   Patient has no known allergies.   Social History   Socioeconomic History  . Marital status: Single  Spouse name: Not on file  . Number of children: Not on file  . Years of education: Not on file  . Highest education level: Not on file  Occupational History  . Occupation: Consulting civil engineer  Tobacco Use  . Smoking status: Never Smoker  . Smokeless tobacco: Never Used  Vaping Use  . Vaping Use: Never used  Substance and Sexual Activity  . Alcohol use: Yes    Alcohol/week: 1.0 standard drink    Types: 1 Shots of liquor per week  . Drug use: No  . Sexual activity: Yes    Partners: Male    Birth  control/protection: Condom, Pill  Other Topics Concern  . Not on file  Social History Narrative   Patient is right handed.   Patient drinks 1 cup of caffeine daily.   Senior at [employer]   Has one older brother   Has a dog   Social Determinants of Corporate investment banker Strain:   . Difficulty of Paying Living Expenses: Not on file  Food Insecurity:   . Worried About Programme researcher, broadcasting/film/video in the Last Year: Not on file  . Ran Out of Food in the Last Year: Not on file  Transportation Needs:   . Lack of Transportation (Medical): Not on file  . Lack of Transportation (Non-Medical): Not on file  Physical Activity:   . Days of Exercise per Week: Not on file  . Minutes of Exercise per Session: Not on file  Stress:   . Feeling of Stress : Not on file  Social Connections:   . Frequency of Communication with Friends and Family: Not on file  . Frequency of Social Gatherings with Friends and Family: Not on file  . Attends Religious Services: Not on file  . Active Member of Clubs or Organizations: Not on file  . Attends Banker Meetings: Not on file  . Marital Status: Not on file     Family History: The patient's family history includes COPD in her paternal grandmother; Heart block in her paternal grandmother; Hyperlipidemia in her maternal grandmother and paternal grandfather; Hypertension in her maternal grandmother. There is no history of Sudden death or Heart attack.  ROS:   Please see the history of present illness.    All other systems reviewed and are negative.  EKGs/Labs/Other Studies Reviewed:    The following studies were reviewed today: EKG reveals sinus rhythm and nonspecific ST-T changes.   Recent Labs: 10/12/2019: ALT 12; BUN 14; Creatinine, Ser 0.86; Hemoglobin 13.8; Magnesium 1.8; Platelets 268.0; Potassium 4.5; Sodium 138; TSH 1.20  Recent Lipid Panel No results found for: CHOL, TRIG, HDL, CHOLHDL, VLDL, LDLCALC, LDLDIRECT  Physical Exam:    VS:  BP  126/76   Pulse (!) 56   Ht 5\' 5"  (1.651 m)   Wt 156 lb 0.6 oz (70.8 kg)   SpO2 100%   BMI 25.97 kg/m     Wt Readings from Last 3 Encounters:  12/02/19 156 lb 0.6 oz (70.8 kg)  10/12/19 156 lb (70.8 kg)  04/19/19 160 lb (72.6 kg)     GEN: Patient is in no acute distress HEENT: Normal NECK: No JVD; No carotid bruits LYMPHATICS: No lymphadenopathy CARDIAC: S1 S2 regular, 2/6 systolic murmur at the apex. RESPIRATORY:  Clear to auscultation without rales, wheezing or rhonchi  ABDOMEN: Soft, non-tender, non-distended MUSCULOSKELETAL:  No edema; No deformity  SKIN: Warm and dry NEUROLOGIC:  Alert and oriented x 3 PSYCHIATRIC:  Normal affect  Signed, Garwin Brothers, MD  12/02/2019 10:15 AM    [facility]

## 2019-12-13 ENCOUNTER — Telehealth: Payer: Self-pay | Admitting: *Deleted

## 2019-12-13 NOTE — Telephone Encounter (Signed)
Belinda Bryan from [facility] sent fax requesting additional documentation to justify monitor ordered on pt. Sent last office note and ekg to [employer] today.

## 2019-12-21 ENCOUNTER — Telehealth: Payer: Self-pay | Admitting: Cardiology

## 2019-12-21 ENCOUNTER — Other Ambulatory Visit: Payer: Self-pay | Admitting: Orthopaedic Surgery

## 2019-12-21 NOTE — Telephone Encounter (Signed)
° ° °  Fernando B - [employer] called, he said they need a copy of pt's referral from pcp, the referral received when pt was referred to Dr. Revankar. He gave fax# [phone] 

## 2019-12-21 NOTE — Telephone Encounter (Signed)
Can you help with this?

## 2019-12-28 ENCOUNTER — Ambulatory Visit
Admission: RE | Admit: 2019-12-28 | Discharge: 2019-12-28 | Disposition: A | Discharge status: Home / Self Care | Payer: No Typology Code available for payment source | Source: Ambulatory Visit | Attending: Orthopaedic Surgery | Admitting: Orthopaedic Surgery

## 2019-12-28 DIAGNOSIS — M25552 Pain in left hip: Secondary | ICD-10-CM

## 2019-12-29 ENCOUNTER — Telehealth: Payer: Self-pay | Admitting: Cardiology

## 2019-12-29 ENCOUNTER — Encounter: Payer: Self-pay | Admitting: Orthopaedic Surgery

## 2019-12-29 ENCOUNTER — Ambulatory Visit (INDEPENDENT_AMBULATORY_CARE_PROVIDER_SITE_OTHER): Payer: No Typology Code available for payment source | Admitting: Orthopaedic Surgery

## 2019-12-29 DIAGNOSIS — M25552 Pain in left hip: Secondary | ICD-10-CM

## 2019-12-29 NOTE — Progress Notes (Signed)
The patient is a 24 year old female with chronic left hip pain.  She is coming in for follow-up after having a MRI of her left hip.  This is bothered her for some time and all sports contribute to it.  She is not having pain right now but she has been resting her hip.  However this has been chronic for her.  She has had a MRI of her lumbar spine showing some mild scoliosis but no stenosis centrally or at the foramina and no nerve compression.  She has also been through at least 2 rounds of physical therapy and that has not helped.  I had her lay in a supine position her leg lengths are equal.  She has full and fluid range of motion of her left hip.  Most of her pain is around the trochanteric area and IT band it does go down the IT band.  The MRI of her left hip is entirely normal.  I went through the MRI as well for my review and did not see anything structural around the hip from the standpoint of the cartilage, the tendons, the muscle, the ligaments or the bone.  We gave her reassurance that at least there is not a surgical indication for treating the pain that she is experiencing with the left hip.  The only thing I can recommend is activity modification.  All questions and concerns were answered addressed.  Follow-up can be as needed.

## 2019-12-29 NOTE — Telephone Encounter (Signed)
Arne Cleveland from [employer] is needed the referral that was faxed to our office from the patient's PCP faxed to their office at [phone]

## 2020-01-02 NOTE — Telephone Encounter (Signed)
Copy of referral faxed to [employer].

## 2020-01-03 ENCOUNTER — Telehealth: Payer: Self-pay

## 2020-01-03 NOTE — Telephone Encounter (Signed)
Spoke with patient regarding results and recommendation.  Patient verbalizes understanding and is agreeable to plan of care. Advised patient to call back with any issues or concerns.  

## 2020-01-03 NOTE — Telephone Encounter (Signed)
-----   Message from Garwin Brothers, MD sent at 12/30/2019 11:42 AM EDT ----- The results of the study is unremarkable. Please inform patient. I will discuss in detail at next appointment. Cc  primary care/referring physician Garwin Brothers, MD 12/30/2019 11:42 AM

## 2020-01-13 ENCOUNTER — Ambulatory Visit (HOSPITAL_BASED_OUTPATIENT_CLINIC_OR_DEPARTMENT_OTHER)
Admission: RE | Admit: 2020-01-13 | Discharge: 2020-01-13 | Disposition: A | Discharge status: Home / Self Care | Payer: No Typology Code available for payment source | Source: Ambulatory Visit | Attending: Cardiology | Admitting: Cardiology

## 2020-01-13 ENCOUNTER — Other Ambulatory Visit: Payer: Self-pay

## 2020-01-13 DIAGNOSIS — R011 Cardiac murmur, unspecified: Secondary | ICD-10-CM | POA: Diagnosis present

## 2020-01-13 DIAGNOSIS — R002 Palpitations: Secondary | ICD-10-CM | POA: Diagnosis not present

## 2020-01-13 LAB — ECHOCARDIOGRAM COMPLETE
Area-P 1/2: 2.39 cm2
S' Lateral: 2.99 cm

## 2020-01-13 MED FILL — VYLIBRA 0.25-35 MG-MCG TABS: 0.25-35 | 84 days supply | Qty: 84 | Fill #1

## 2020-01-16 ENCOUNTER — Ambulatory Visit (INDEPENDENT_AMBULATORY_CARE_PROVIDER_SITE_OTHER): Payer: No Typology Code available for payment source | Admitting: Cardiology

## 2020-01-16 ENCOUNTER — Encounter: Payer: Self-pay | Admitting: Cardiology

## 2020-01-16 ENCOUNTER — Other Ambulatory Visit: Payer: Self-pay

## 2020-01-16 VITALS — BP 120/68 | HR 84 | Ht 65.0 in | Wt 160.1 lb

## 2020-01-16 DIAGNOSIS — R002 Palpitations: Secondary | ICD-10-CM

## 2020-01-16 NOTE — Progress Notes (Signed)
Cardiology Office Note:    Date:  01/16/2020   ID:  Belinda Bryan, DOB [birth year], MRN [id number]  PCP:  Sandford Craze, NP  Cardiologist:  Garwin Brothers, MD   Referring MD: Sandford Craze, NP    ASSESSMENT:    1. Palpitations    PLAN:    In order of problems listed above:  1. Palpitations: I reassured the patient about my findings.  Her thyroid, monitoring and echocardiogram was unremarkable.  I told her to keep her self well-hydrated and be active and she promises to do so.  She has not had any significant issues such as dizzy spells or palpitations.  She has been fine over the past several weeks.Patient will be seen in follow-up appointment in 6 months or earlier if the patient has any concerns 2. I also mentioned to her about the [facility] app for monitoring herself when she has these episodes.   Medication Adjustments/Labs and Tests Ordered: Current medicines are reviewed at length with the patient today.  Concerns regarding medicines are outlined above.  No orders of the defined types were placed in this encounter.  No orders of the defined types were placed in this encounter.    No chief complaint on file.    History of Present Illness:    Belinda Bryan is a 24 y.o. female.  Patient has past medical history of palpitations and she was evaluated for this.  She denies any other problems.  No chest pain orthopnea PND syncope.  Her echocardiogram and monitoring was fine without any significant findings.  She continues to have occasional palpitations and these were present even during the monitoring.  Patient is accompanied by her mother for this visit.  Past Medical History:  Diagnosis Date  . Acne 07/16/2014  . Cardiac murmur 12/02/2019  . Decreased hearing of right ear 07/16/2014  . Left arm weakness 05/17/2014   Formatting of this note might be different from the original. 24 yo female referred to Dr. Glenna Durand by [facility], Dr. Hennie Duos. Willis  for thoracic outlet syndrome, left.  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  Extensive workup including MRI of the C-spine, chest "area", arterial doppler  . Lumbar radiculopathy 03/15/2018  . Lumbar scoliosis 12/14/2017  . Palpitations 12/02/2019  . 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 Thoracic outlet syndrome 05/17/2014  . Trochanteric bursitis of left hip 03/15/2018    Past Surgical History:  Procedure Laterality Date  . THORACIC OUTLET SURGERY      Current Medications: Current Meds  Medication Sig  . acetaminophen (TYLENOL) 500 MG tablet Take 500 mg by mouth as needed.  . Multiple Vitamins-Minerals (MULTIVITAMIN WITH MINERALS) tablet Take 1 tablet by mouth daily.  . NON FORMULARY retna cream  . VYLIBRA 0.25-35 MG-MCG tablet Take 1 tablet by mouth at bedtime.     Allergies:   Patient has no known allergies.   Social History   Socioeconomic History  . Marital status: Single    Spouse name: Not on file  . Number of children: Not on file  . Years of education: Not on file  . Highest education level: Not on file  Occupational History  . Occupation: Consulting civil engineer  Tobacco Use  . Smoking status: Never Smoker  . Smokeless tobacco: Never Used  Vaping Use  . Vaping Use: Never used  Substance and Sexual Activity  . Alcohol use: Yes  Alcohol/week: 1.0 standard drink    Types: 1 Shots of liquor per week  . Drug use: No  . Sexual activity: Yes    Partners: Male    Birth control/protection: Condom, Pill  Other Topics Concern  . Not on file  Social History Narrative   Patient is right handed.   Patient drinks 1 cup of caffeine daily.   Senior at [employer]   Has one older brother   Has a dog   Social Determinants of Corporate investment banker Strain:   . Difficulty of Paying Living Expenses: Not on file  Food Insecurity:   . Worried About Programme researcher, broadcasting/film/video in the Last Year: Not on file   . Ran Out of Food in the Last Year: Not on file  Transportation Needs:   . Lack of Transportation (Medical): Not on file  . Lack of Transportation (Non-Medical): Not on file  Physical Activity:   . Days of Exercise per Week: Not on file  . Minutes of Exercise per Session: Not on file  Stress:   . Feeling of Stress : Not on file  Social Connections:   . Frequency of Communication with Friends and Family: Not on file  . Frequency of Social Gatherings with Friends and Family: Not on file  . Attends Religious Services: Not on file  . Active Member of Clubs or Organizations: Not on file  . Attends Banker Meetings: Not on file  . Marital Status: Not on file     Family History: The patient's family history includes COPD in her paternal grandmother; Heart block in her paternal grandmother; Hyperlipidemia in her maternal grandmother and paternal grandfather; Hypertension in her maternal grandmother. There is no history of Sudden death or Heart attack.  ROS:   Please see the history of present illness.    All other systems reviewed and are negative.  EKGs/Labs/Other Studies Reviewed:    The following studies were reviewed today: I discussed my findings with the patient at length echocardiogram and monitoring was unremarkable.   Recent Labs: 10/12/2019: ALT 12; BUN 14; Creatinine, Ser 0.86; Hemoglobin 13.8; Magnesium 1.8; Platelets 268.0; Potassium 4.5; Sodium 138; TSH 1.20  Recent Lipid Panel No results found for: CHOL, TRIG, HDL, CHOLHDL, VLDL, LDLCALC, LDLDIRECT  Physical Exam:    VS:  BP 120/68   Pulse 84   Ht 5\' 5"  (1.651 m)   Wt 160 lb 1.3 oz (72.6 kg)   SpO2 99%   BMI 26.64 kg/m     Wt Readings from Last 3 Encounters:  01/16/20 160 lb 1.3 oz (72.6 kg)  12/02/19 156 lb 0.6 oz (70.8 kg)  10/12/19 156 lb (70.8 kg)     GEN: Patient is in no acute distress HEENT: Normal NECK: No JVD; No carotid bruits LYMPHATICS: No lymphadenopathy CARDIAC: Hear sounds  regular, 2/6 systolic murmur at the apex. RESPIRATORY:  Clear to auscultation without rales, wheezing or rhonchi  ABDOMEN: Soft, non-tender, non-distended MUSCULOSKELETAL:  No edema; No deformity  SKIN: Warm and dry NEUROLOGIC:  Alert and oriented x 3 PSYCHIATRIC:  Normal affect   Signed, 12/12/19, MD  01/16/2020 2:30 PM    [facility]

## 2020-01-16 NOTE — Patient Instructions (Signed)

## 2020-05-11 ENCOUNTER — Other Ambulatory Visit: Payer: Self-pay | Admitting: Nurse Practitioner

## 2020-05-11 ENCOUNTER — Other Ambulatory Visit: Payer: Self-pay

## 2020-05-11 MED ORDER — VYLIBRA 0.25-35 MG-MCG PO TABS: 1.0000 | ORAL_TABLET | Freq: Every day | ORAL | 0 refills | Status: DC

## 2020-05-11 MED FILL — VYLIBRA 0.25-35 MG-MCG TABS: 0.25-35 | 28 days supply | Qty: 28 | Fill #0

## 2020-05-11 NOTE — Telephone Encounter (Signed)
Medication refill request: vylibra (sprintec) Last AEX:  04-19-2019 Next AEX: 05-18-2020 with KD Last MMG (if hormonal medication request): n/a Refill authorized: please approve 1 pack if appropriate

## 2020-05-17 NOTE — Progress Notes (Signed)
25 y.o. G0P0000 Single White or Caucasian female here for annual exam.    She is happy with her birth control pill, denies problems  Lives in [place]. 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  In May started having heart palpitations, wore a heart monitor and had an echo, no murmur detected. Still has palpatations. Last time was in February. It lasted 45 seconds.    Period Duration (Days): 4-6 Period Pattern: Regular Menstrual Flow: Moderate Menstrual Control: Tampon Dysmenorrhea: (!) Moderate (some severe days) Dysmenorrhea Symptoms: Cramping,Nausea,Headache  Patient's last menstrual period was 05/13/2020 (exact date).          Sexually active: Yes.    The current method of family planning is OCP (estrogen/progesterone) & condoms.    Exercising: Yes.    weights & running Smoker:  no  Health Maintenance: Pap:  04-19-2019 neg History of abnormal Pap:  no MMG:  none Colonoscopy:  none BMD:   none TDaP:  2020 Gardasil:   Allergy to gardasil Covid-19: pfizer Hep C testing: not done    reports that she has never smoked. She has never used smokeless tobacco. She reports current alcohol use of about 1.0 standard drink of alcohol per week. She reports that she does not use drugs.  Past Medical History:  Diagnosis Date  . Acne 07/16/2014  . Cardiac murmur 12/02/2019  . Decreased hearing of right ear 07/16/2014  . Left arm weakness 05/17/2014   Formatting of this note might be different from the original. 25 yo female referred to Dr. Glenna Durand by [facility], Dr. Hennie Duos. Willis for thoracic outlet syndrome, left.  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  Extensive workup including MRI of the C-spine, chest "area", arterial doppler  . Lumbar radiculopathy 03/15/2018  . Lumbar scoliosis 12/14/2017  . Palpitations 12/02/2019  . Snapping  hip syndrome, left 03/15/2018  . Thoracic outlet syndrome 05/17/2014  . Trochanteric bursitis of left hip 03/15/2018    Past Surgical History:  Procedure Laterality Date  . THORACIC OUTLET SURGERY      Current Outpatient Medications  Medication Sig Dispense Refill  . acetaminophen (TYLENOL) 500 MG tablet Take 500 mg by mouth as needed.    . Multiple Vitamins-Minerals (MULTIVITAMIN WITH MINERALS) tablet Take 1 tablet by mouth daily.    . NON FORMULARY retna cream    . VYLIBRA 0.25-35 MG-MCG tablet Take 1 tablet by mouth at bedtime. 28 tablet 0   No current facility-administered medications for this visit.    Family History  Problem Relation Age of Onset  . Hypertension Maternal Grandmother   . Hyperlipidemia Maternal Grandmother   . COPD Paternal Grandmother   . Heart block Paternal Grandmother   . Hyperlipidemia Paternal Grandfather   . Sudden death Neg Hx   . Heart attack Neg Hx     Review of Systems  Exam:   BP 110/68   Pulse 68   Resp 16   Ht 5' 5.75" (1.67 m)   Wt 160 lb (72.6 kg)   LMP 05/13/2020 (Exact Date)   BMI 26.02 kg/m   Height: 5' 5.75" (167 cm)  General appearance: alert, cooperative and appears stated age, no acute distress Head: Normocephalic, without obvious abnormality Neck: no adenopathy, thyroid normal to inspection and palpation Lungs: clear to auscultation bilaterally Breasts: normal appearance, no masses or tenderness Heart:  regular rate and rhythm Abdomen: soft, non-tender; no masses,  no organomegaly Extremities: extremities normal, no edema Skin: No rashes or lesions Lymph nodes: Cervical, supraclavicular, and axillary nodes normal. No abnormal inguinal nodes palpated Neurologic: Grossly normal   Pelvic: External genitalia:  no lesions              Urethra:  normal appearing urethra with no masses, tenderness or lesions              Bartholins and Skenes: normal                 Vagina: normal appearing vagina, appropriate for age, normal  appearing discharge, no lesions              Cervix: neg cervical motion tenderness, no visible lesions             Bimanual Exam:   Uterus:  normal size, contour, position, consistency, mobility, non-tender              Adnexa: no mass, fullness, tenderness                 Joy, CMA Chaperone was present for exam.  A:  Well woman exam with routine gynecological exam - Plan: VYLIBRA 0.25-35 MG-MCG tablet, C. trachomatis/N. gonorrhoeae RNA  Surveillance of previously prescribed contraceptive pill   P:   Pap : next pap due 2024  Medications: OCP Vylibra (sprintec)

## 2020-05-18 ENCOUNTER — Ambulatory Visit (INDEPENDENT_AMBULATORY_CARE_PROVIDER_SITE_OTHER): Payer: No Typology Code available for payment source | Admitting: Nurse Practitioner

## 2020-05-18 ENCOUNTER — Other Ambulatory Visit: Payer: Self-pay | Admitting: Nurse Practitioner

## 2020-05-18 ENCOUNTER — Other Ambulatory Visit: Payer: Self-pay

## 2020-05-18 ENCOUNTER — Encounter: Payer: Self-pay | Admitting: Nurse Practitioner

## 2020-05-18 VITALS — BP 110/68 | HR 68 | Resp 16 | Ht 65.75 in | Wt 160.0 lb

## 2020-05-18 DIAGNOSIS — Z3041 Encounter for surveillance of contraceptive pills: Secondary | ICD-10-CM | POA: Diagnosis not present

## 2020-05-18 DIAGNOSIS — Z01419 Encounter for gynecological examination (general) (routine) without abnormal findings: Secondary | ICD-10-CM | POA: Diagnosis not present

## 2020-05-18 MED ORDER — VYLIBRA 0.25-35 MG-MCG PO TABS: 1.0000 | ORAL_TABLET | Freq: Every day | ORAL | 3 refills | Status: DC

## 2020-05-18 NOTE — Patient Instructions (Addendum)
Health Maintenance, Female Adopting a healthy lifestyle and getting preventive care are important in promoting health and wellness. Ask your health care provider about:  The right schedule for you to have regular tests and exams.  Things you can do on your own to prevent diseases and keep yourself healthy. What should I know about diet, weight, and exercise? Eat a healthy diet  Eat a diet that includes plenty of vegetables, fruits, low-fat dairy products, and lean protein.  Do not eat a lot of foods that are high in solid fats, added sugars, or sodium.   Maintain a healthy weight Body mass index (BMI) is used to identify weight problems. It estimates body fat based on height and weight. Your health care provider can help determine your BMI and help you achieve or maintain a healthy weight. Get regular exercise Get regular exercise. This is one of the most important things you can do for your health. Most adults should:  Exercise for at least 150 minutes each week. The exercise should increase your heart rate and make you sweat (moderate-intensity exercise).  Do strengthening exercises at least twice a week. This is in addition to the moderate-intensity exercise.  Spend less time sitting. Even light physical activity can be beneficial. Watch cholesterol and blood lipids Have your blood tested for lipids and cholesterol at 23 years of age, then have this test every 5 years. Have your cholesterol levels checked more often if:  Your lipid or cholesterol levels are high.  You are older than 23 years of age.  You are at high risk for heart disease. What should I know about cancer screening? Depending on your health history and family history, you may need to have cancer screening at various ages. This may include screening for:  Breast cancer.  Cervical cancer.  Colorectal cancer.  Skin cancer.  Lung cancer. What should I know about heart disease, diabetes, and high blood  pressure? Blood pressure and heart disease  High blood pressure causes heart disease and increases the risk of stroke. This is more likely to develop in people who have high blood pressure readings, are of African descent, or are overweight.  Have your blood pressure checked: ? Every 3-5 years if you are 18-39 years of age. ? Every year if you are 40 years old or older. Diabetes Have regular diabetes screenings. This checks your fasting blood sugar level. Have the screening done:  Once every three years after age 40 if you are at a normal weight and have a low risk for diabetes.  More often and at a younger age if you are overweight or have a high risk for diabetes. What should I know about preventing infection? Hepatitis B If you have a higher risk for hepatitis B, you should be screened for this virus. Talk with your health care provider to find out if you are at risk for hepatitis B infection. Hepatitis C Testing is recommended for:  Everyone born from 1945 through 1965.  Anyone with known risk factors for hepatitis C. Sexually transmitted infections (STIs)  Get screened for STIs, including gonorrhea and chlamydia, if: ? You are sexually active and are younger than 24 years of age. ? You are older than 24 years of age and your health care provider tells you that you are at risk for this type of infection. ? Your sexual activity has changed since you were last screened, and you are at increased risk for chlamydia or gonorrhea. Ask your health care provider   if you are at risk.  Ask your health care provider about whether you are at high risk for HIV. Your health care provider may recommend a prescription medicine to help prevent HIV infection. If you choose to take medicine to prevent HIV, you should first get tested for HIV. You should then be tested every 3 months for as long as you are taking the medicine. Pregnancy  If you are about to stop having your period (premenopausal) and  you may become pregnant, seek counseling before you get pregnant.  Take 400 to 800 micrograms (mcg) of folic acid every day if you become pregnant.  Ask for birth control (contraception) if you want to prevent pregnancy. Osteoporosis and menopause Osteoporosis is a disease in which the bones lose minerals and strength with aging. This can result in bone fractures. If you are 25 years old or older, or if you are at risk for osteoporosis and fractures, ask your health care provider if you should:  Be screened for bone loss.  Take a calcium or vitamin D supplement to lower your risk of fractures.  Be given hormone replacement therapy (HRT) to treat symptoms of menopause. Follow these instructions at home: Lifestyle  Do not use any products that contain nicotine or tobacco, such as cigarettes, e-cigarettes, and chewing tobacco. If you need help quitting, ask your health care provider.  Do not use street drugs.  Do not share needles.  Ask your health care provider for help if you need support or information about quitting drugs. Alcohol use  Do not drink alcohol if: ? Your health care provider tells you not to drink. ? You are pregnant, may be pregnant, or are planning to become pregnant.  If you drink alcohol: ? Limit how much you use to 0-1 drink a day. ? Limit intake if you are breastfeeding.  Be aware of how much alcohol is in your drink. In the [place] one drink equals one 12 oz bottle of beer (355 mL), one 5 oz glass of wine (148 mL), or one 1 oz glass of hard liquor (44 mL). General instructions  Schedule regular health, dental, and eye exams.  Stay current with your vaccines.  Tell your health care provider if: ? You often feel depressed. ? You have ever been abused or do not feel safe at home. Summary  Adopting a healthy lifestyle and getting preventive care are important in promoting health and wellness.  Follow your health care provider's instructions about healthy  diet, exercising, and getting tested or screened for diseases.  Follow your health care provider's instructions on monitoring your cholesterol and blood pressure. This information is not intended to replace advice given to you by your health care provider. Make sure you discuss any questions you have with your health care provider. Document Revised: 02/17/2018 Document Reviewed: 02/17/2018 Elsevier Patient Education  2021 [employer].   This is not a confirmed diagnosis but some of your symptoms sound like you could be experiencing this. Keep monitoring your symptoms: Supraventricular Tachycardia, Adult Supraventricular tachycardia (SVT) is a kind of abnormal heartbeat. It makes your heart beat very fast. This may last for a short time and then return to normal, or it may last longer. A normal resting heartbeat is 60-100 times a minute. This condition can make your heart beat more than 150 times a minute. Times of having a fast heartbeat (episodes) can be scary, but they are usually not dangerous. In some cases, they may lead to heart failure if  they:  Happen many times a day.  Last longer than a few seconds. What are the causes? This condition happens when electrical signals are sent out from areas of the heart that do not normally send signals for the heartbeat.   What increases the risk? You are more likely to develop this condition if you are:  Middle aged or younger.  Female. The following factors may also make you more likely to develop this condition:  Stress.  Feeling worried or nervous (anxiety).  Tiredness.  Smoking.  Stimulant drugs, such as cocaine and methamphetamine.  Alcohol.  Caffeine.  Pregnancy.  Having certain medical conditions. What are the signs or symptoms?  A pounding heart.  A feeling that your heart is skipping beats (palpitations).  Weakness.  Trouble getting enough air.  Pain or tightness in your chest.  Dizziness or feeling like  you are going to pass out (faint).  Feeling worried or nervous.  Sweating.  Feeling like you may vomit (nausea).  Passing out.  Tiredness. Sometimes, there are no symptoms. How is this treated? Treatment may include:  Vagal nerve stimulation. Ways to do this include: ? Holding your breath and pushing, as though you are pooping (having a bowel movement). ? Massaging an area on one side of your neck. Do not try this yourself. Only a doctor should do this. If done the wrong way, it can lead to a stroke. ? Bending forward with your head between your legs. ? Coughing while bending forward with your head between your legs. ? Putting an ice-cold, wet towel on your face.  Medicines that prevent attacks.  Medicine to stop an attack given through an IV tube at the hospital.  A small electric shock (cardioversion) that stops an attack.  A procedure to get rid of cells in the area that is causing the fast heartbeats (radiofrequency ablation). If you do not have symptoms, you may not need treatment. Follow these instructions at home: Stress  Avoid things that make you feel stressed.  To deal with stress, try: ? Doing yoga or meditation. ? Being out in nature. ? Listening to relaxing music. ? Doing deep breathing. ? Taking steps to be healthy, such as getting lots of sleep, exercising, and eating a balanced diet. ? Talking with a mental health doctor. Lifestyle  Try to get at least 7 hours of sleep each night.  Do not smoke or use any products that contain nicotine or tobacco. If you need help quitting, ask your doctor.  Do not drink alcohol if it gives you a fast heartbeat.  If alcohol does not seem to give you a fast heartbeat, limit your alcohol use. If you drink alcohol: ? Limit how much you have to:  0-1 drink a day for women who are not pregnant.  0-2 drinks a day for men. ? Know how much alcohol is in your drink. In the [place] one drink equals one 12 oz bottle of beer  (355 mL), one 5 oz glass of wine (148 mL), or one 1 oz glass of hard liquor (44 mL).  Be aware of how caffeine affects you. ? If caffeine gives you a fast heartbeat, do not eat, drink, or use anything with caffeine in it. ? If caffeine does not seem to give you a fast heartbeat, limit how much caffeine you eat, drink, or use.  Do not use stimulant drugs. If you need help quitting, ask your doctor.   General instructions  Stay at a healthy weight.  Exercise regularly. Ask your doctor about good activities for you. Try one or a mixture of these: ? 150 minutes a week of gentle exercise, like walking or yoga. ? 75 minutes a week of exercise that is very active, like running or swimming.  Do vagus nerve treatments to slow down your heartbeat as told by your doctor.  Take over-the-counter and prescription medicines only as told by your doctor.  Keep all follow-up visits. Contact a doctor if:  You have a fast heartbeat more often.  Times of having a fast heartbeat last longer than before.  Home treatments to slow down your heartbeat do not help.  You have new symptoms. Get help right away if:  You have chest pain.  Your symptoms get worse.  You have trouble breathing.  Your heart beats very fast for more than 20 minutes.  You pass out. These symptoms may be an emergency. Get medical help right away. Call your local emergency services (911 in the [place]  Do not wait to see if the symptoms will go away.  Do not drive yourself to the hospital. Summary  SVT is a type of abnormal heartbeat.  This condition can make your heart beat more than 150 times a minute.  If you do not have symptoms, you may not need treatment. This information is not intended to replace advice given to you by your health care provider. Make sure you discuss any questions you have with your health care provider. Document Revised: 10/08/2019 Document Reviewed: 10/08/2019 Elsevier Patient Education   2021 [employer].

## 2020-05-21 LAB — C. TRACHOMATIS/N. GONORRHOEAE RNA
C. trachomatis RNA, TMA: NOT DETECTED
N. gonorrhoeae RNA, TMA: NOT DETECTED

## 2020-06-08 MED FILL — VYLIBRA 0.25-35 MG-MCG TABS: 0.25-35 | 84 days supply | Qty: 84 | Fill #0

## 2020-08-10 ENCOUNTER — Other Ambulatory Visit (HOSPITAL_BASED_OUTPATIENT_CLINIC_OR_DEPARTMENT_OTHER): Payer: Self-pay

## 2020-08-10 MED FILL — Norgestimate & Ethinyl Estradiol Tab 0.25 MG-35 MCG: ORAL | 84 days supply | Qty: 84 | Fill #0 | Status: CN

## 2020-08-14 ENCOUNTER — Other Ambulatory Visit (HOSPITAL_BASED_OUTPATIENT_CLINIC_OR_DEPARTMENT_OTHER): Payer: Self-pay

## 2020-08-14 MED FILL — Norgestimate & Ethinyl Estradiol Tab 0.25 MG-35 MCG: ORAL | 84 days supply | Qty: 84 | Fill #0 | Status: AC

## 2020-11-21 ENCOUNTER — Other Ambulatory Visit (HOSPITAL_BASED_OUTPATIENT_CLINIC_OR_DEPARTMENT_OTHER): Payer: Self-pay

## 2021-01-15 ENCOUNTER — Other Ambulatory Visit: Payer: Self-pay | Admitting: *Deleted

## 2021-01-15 DIAGNOSIS — Z01419 Encounter for gynecological examination (general) (routine) without abnormal findings: Secondary | ICD-10-CM

## 2021-01-15 MED ORDER — VYLIBRA 0.25-35 MG-MCG PO TABS
1.0000 | ORAL_TABLET | Freq: Every day | ORAL | 0 refills | Status: DC
  Filled 2021-01-23 – 2021-02-15 (×2): qty 84, 84d supply, fill #0

## 2021-01-18 IMAGING — MR MR HIP*L* W/O CM
5 series · 40 of 40 positions shown · non-contrast
Comparison: Plain films left hip 12/14/2017.

CLINICAL DATA: Left hip and leg pain since an injury playing
volleyball 3-4 years ago.

EXAM:
MR OF THE LEFT HIP WITHOUT CONTRAST
TECHNIQUE: Multiplanar, multisequence MR imaging was performed. No intravenous
contrast was administered.

[Series 9: T2 fat-sat · coronal · left · 3.0mm · 1.56mm/px · 11 of 51 slices shown (1 of 2)]
[im 1/51]
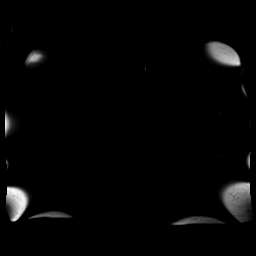
[im 6/51]
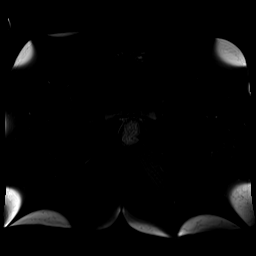
[im 11/51]
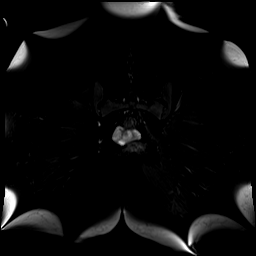
[im 16/51]
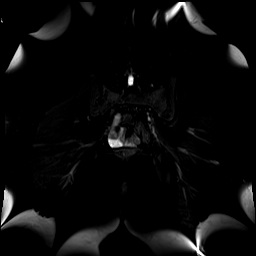
[im 21/51]
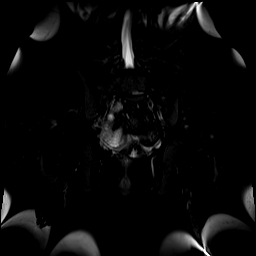
[im 26/51]
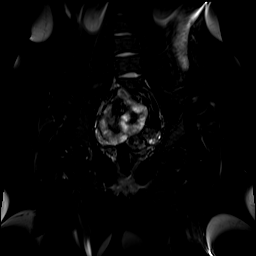
[im 31/51]
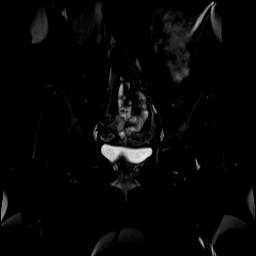
[im 36/51]
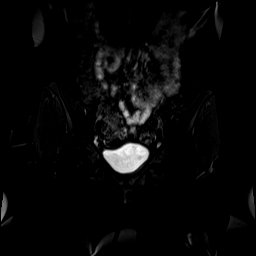
[im 41/51]
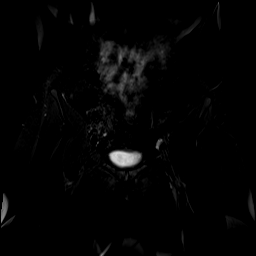
[im 46/51]
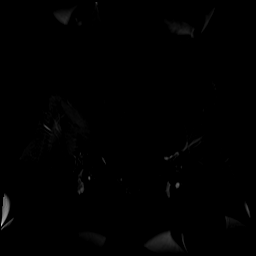
[im 51/51]
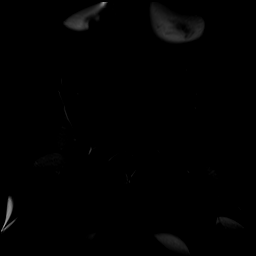

[Series 10: T1 · coronal · left · 3.0mm · 0.89mm/px · 9 of 45 slices shown]
[im 1/45]
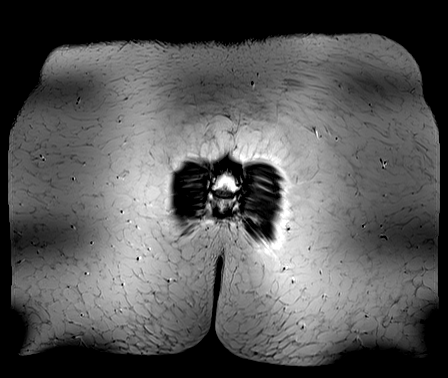
[im 6/45]
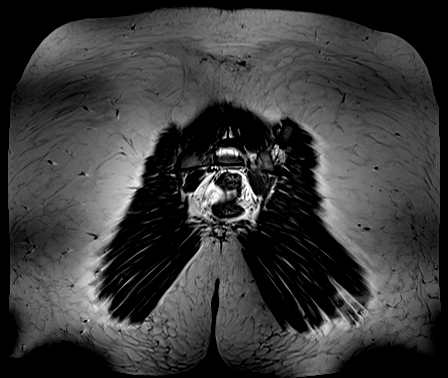
[im 12/45]
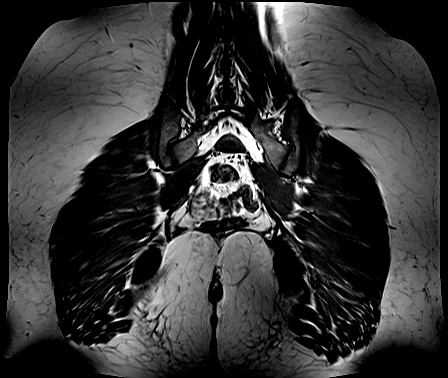
[im 17/45]
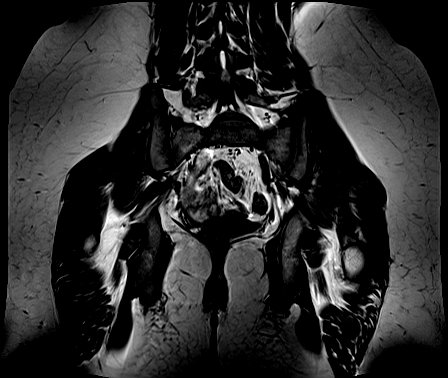
[im 23/45]
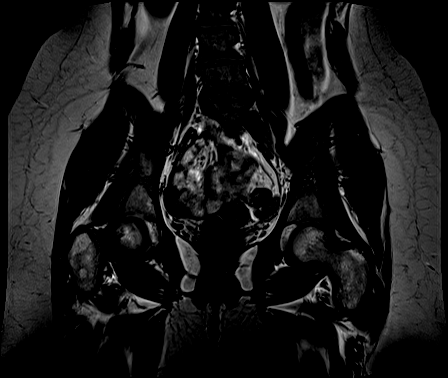
[im 28/45]
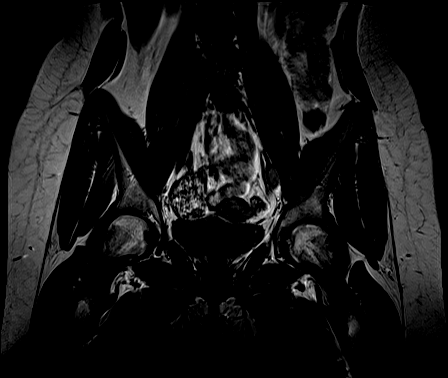
[im 34/45]
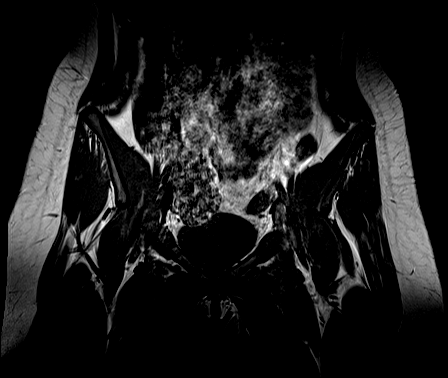
[im 39/45]
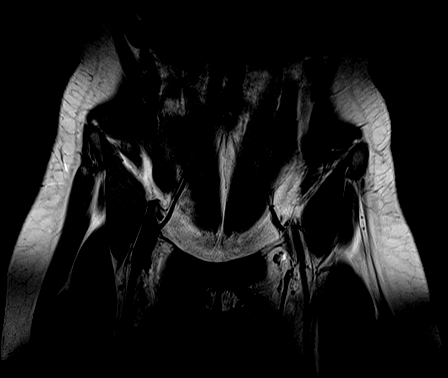
[im 45/45]
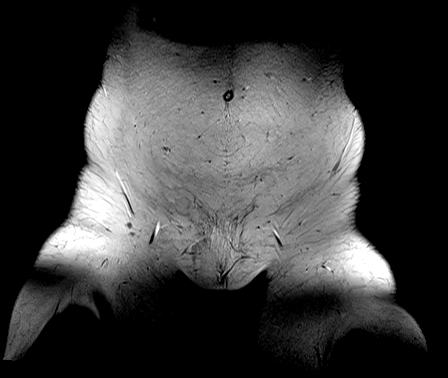

[Series 11: T2 fat-sat · axial · left · 3.0mm · 0.70mm/px · z∈[-162,-36]mm · 7 of 36 slices shown (2 of 2)]
[im 1/36]
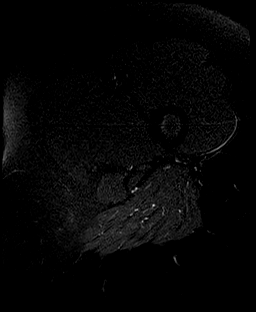
[im 6/36]
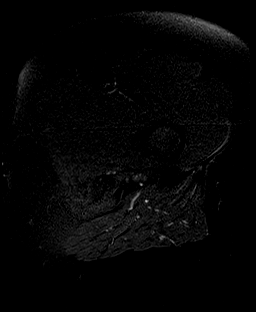
[im 12/36]
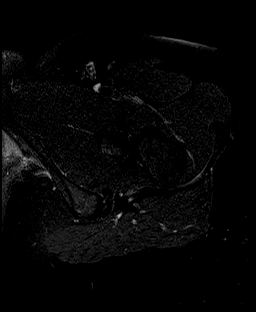
[im 18/36]
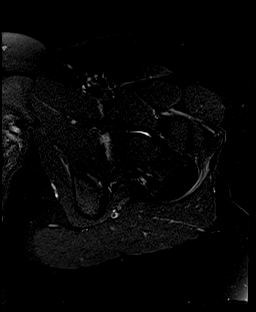
[im 24/36]
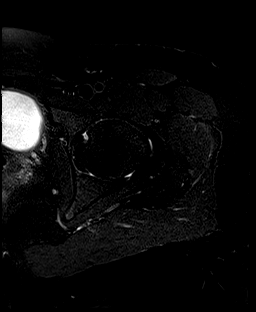
[im 30/36]
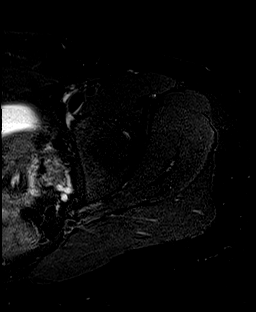
[im 36/36]
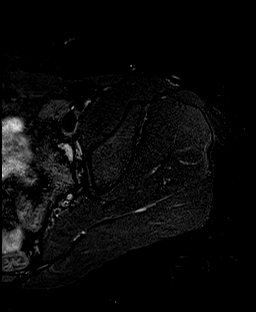

[Series 12: PD fat-sat · coronal · left · 3.0mm · 0.70mm/px · 6 of 31 slices shown (1 of 2)]
[im 1/31]
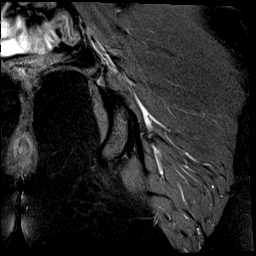
[im 7/31]
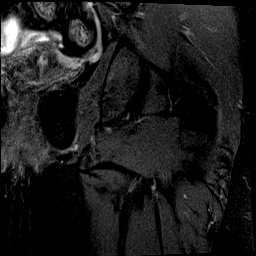
[im 13/31]
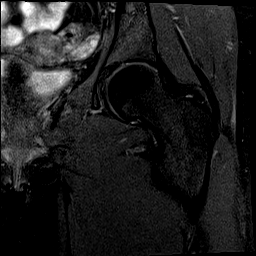
[im 19/31]
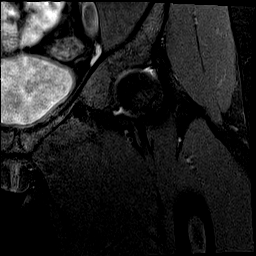
[im 25/31]
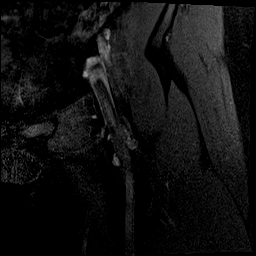
[im 31/31]
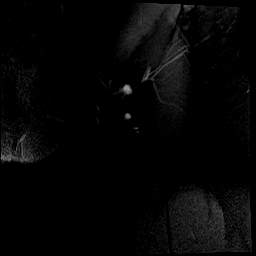

[Series 13: PD fat-sat · sagittal · left · 3.0mm · 0.56mm/px · 7 of 35 slices shown (2 of 2)]
[im 1/35]
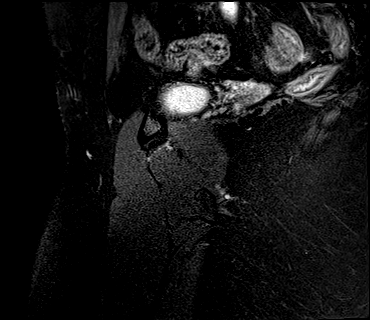
[im 6/35]
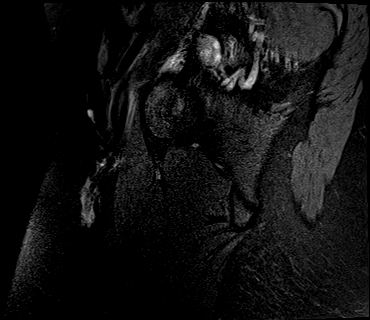
[im 12/35]
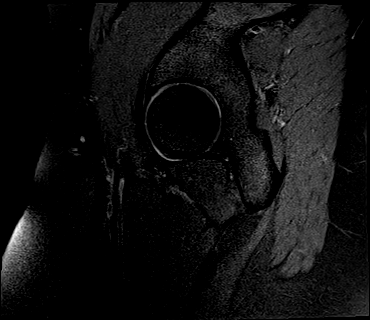
[im 18/35]
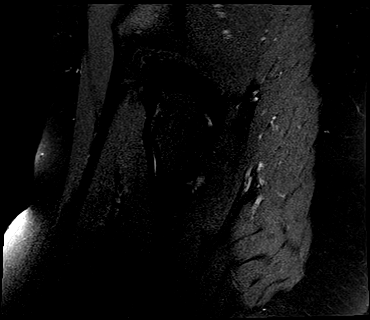
[im 23/35]
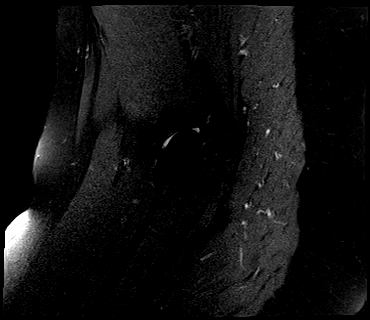
[im 29/35]
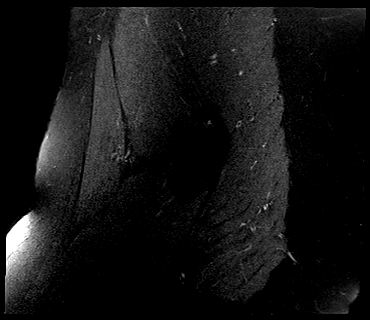
[im 35/35]
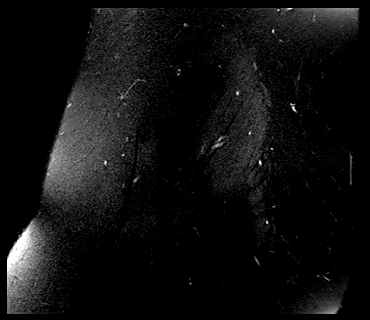

[40 of 40 positions shown; findings below may reference images not displayed]

FINDINGS: Bones: Marrow signal is normal throughout without fracture, stress
change or focal lesion. No avascular necrosis of the femoral heads.
No subchondral cyst formation or edema about either hip. Mild convex
right lower lumbar curvature may be positional.

Articular cartilage and labrum

Articular cartilage:  Normal.

Labrum:  Intact and normal in appearance.

Joint or bursal effusion

Joint effusion:  None.

Bursae: Negative.

Muscles and tendons

Muscles and tendons:  Intact and normal in appearance.

Other findings

Miscellaneous:   Imaged intrapelvic contents are normal.
IMPRESSION: Normal left hip MRI.

Mild convex right lumbar curvature may be positional.

## 2021-01-23 ENCOUNTER — Other Ambulatory Visit (HOSPITAL_BASED_OUTPATIENT_CLINIC_OR_DEPARTMENT_OTHER): Payer: Self-pay

## 2021-02-15 ENCOUNTER — Other Ambulatory Visit (HOSPITAL_BASED_OUTPATIENT_CLINIC_OR_DEPARTMENT_OTHER): Payer: Self-pay

## 2021-02-16 ENCOUNTER — Other Ambulatory Visit (HOSPITAL_BASED_OUTPATIENT_CLINIC_OR_DEPARTMENT_OTHER): Payer: Self-pay

## 2021-02-18 ENCOUNTER — Other Ambulatory Visit (HOSPITAL_BASED_OUTPATIENT_CLINIC_OR_DEPARTMENT_OTHER): Payer: Self-pay

## 2021-02-27 ENCOUNTER — Telehealth: Payer: Self-pay | Admitting: Family

## 2021-02-27 NOTE — Telephone Encounter (Signed)
Patient states she is getting TB test where she is currently, but will come in for the immunizations. She wants to know if there will be an issue with her getting the tb test somewhere else. Please advice.

## 2021-02-27 NOTE — Telephone Encounter (Signed)
Patient is scheduled to come in 12-23. Will need tb test and immunizations in order for [place] to complete for for school. Forms in office in provider's folder.

## 2021-02-27 NOTE — Telephone Encounter (Signed)
Pt stated she needed an appointment because Melissa had informed her she was missing shots, after pt looked at her mychart she is unsure what immunizations she is missing. Please advise.

## 2021-02-27 NOTE — Telephone Encounter (Signed)
Patient is aware and will come to her 12/23 appointment.

## 2021-03-01 ENCOUNTER — Ambulatory Visit (INDEPENDENT_AMBULATORY_CARE_PROVIDER_SITE_OTHER): Payer: No Typology Code available for payment source | Admitting: Family

## 2021-03-01 ENCOUNTER — Telehealth: Payer: Self-pay | Admitting: Family

## 2021-03-01 ENCOUNTER — Encounter: Payer: Self-pay | Admitting: Family

## 2021-03-01 VITALS — BP 126/50 | HR 68 | Temp 98.5°F | Resp 16 | Ht 65.0 in | Wt 171.4 lb

## 2021-03-01 DIAGNOSIS — Z111 Encounter for screening for respiratory tuberculosis: Secondary | ICD-10-CM

## 2021-03-01 DIAGNOSIS — Z0000 Encounter for general adult medical examination without abnormal findings: Secondary | ICD-10-CM

## 2021-03-01 LAB — LIPID PANEL
Cholesterol: 136 mg/dL (ref 0–200)
HDL: 62.5 mg/dL (ref >39.00)
LDL Cholesterol: 56 mg/dL (ref 0–99)
NonHDL: 73.05
Total CHOL/HDL Ratio: 2
Triglycerides: 85 mg/dL (ref 0.0–149.0)
VLDL: 17 mg/dL (ref 0.0–40.0)

## 2021-03-01 NOTE — Progress Notes (Signed)
Subjective:     Patient ID: Belinda Bryan, female    DOB: 26-Aug-1995, 25 y.o.   MRN: [id number]  Chief Complaint  Patient presents with   Follow-up    Here for follow up Immunizations   Immunizations    HPI  Patient presents today for complete physical.  Immunizations: up to date Diet: healthy Wt Readings from Last 3 Encounters:  03/01/21 171 lb 6.4 oz (77.7 kg)  05/18/20 160 lb (72.6 kg)  01/16/20 160 lb 1.3 oz (72.6 kg)  Exercise: runs, weight lifting Pap Smear: 04/19/2019 Vision: up to date Dental:  up to date.    Health Maintenance Due  Topic Date Due   HIV Screening  Never done   HPV VACCINES (2 - 3-dose series) 05/10/2015   Hepatitis C Screening  Never done   PAP SMEAR-Modifier  Never done   COVID-19 Vaccine (3 - Booster for Pfizer series) 09/05/2019    Past Medical History:  Diagnosis Date   Acne 07/16/2014   Cardiac murmur 12/02/2019   Decreased hearing of right ear 07/16/2014   Left arm weakness 05/17/2014   Formatting of this note might be different from the original. 25 yo female referred to Dr. Glenna Durand by [facility], Dr. Hennie Duos. Willis for thoracic outlet syndrome, left.  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   Thoracic outlet syndrome 05/17/2014   Trochanteric bursitis of left hip 03/15/2018    Past Surgical History:  Procedure Laterality Date   THORACIC OUTLET SURGERY      Family History  Problem Relation Age of Onset   Hypertension Maternal Grandmother    Hyperlipidemia Maternal Grandmother    COPD Paternal Grandmother    Heart block Paternal Grandmother    Hyperlipidemia Paternal Grandfather    Sudden death Neg Hx    Heart attack Neg  Hx     Social History   Socioeconomic History   Marital status: Single    Spouse name: Not on file   Number of children: Not on file   Years of education: Not on file   Highest education level: Not on file  Occupational History   Occupation: student  Tobacco Use   Smoking status: Never   Smokeless tobacco: Never  Vaping Use   Vaping Use: Never used  Substance and Sexual Activity   Alcohol use: Yes    Alcohol/week: 1.0 standard drink    Types: 1 Standard drinks or equivalent per week   Drug use: No   Sexual activity: Yes    Partners: Male    Birth control/protection: Condom, Pill  Other Topics Concern   Not on file  Social History Narrative   Patient is right handed.   Patient drinks 1 cup of caffeine daily.   Will Attend an OT program    Has one older brother   Has a dog   Social Determinants of Health   Financial Resource Strain: Not on file  Food Insecurity: Not on file  Transportation Needs: Not on file  Physical Activity: Not on file  Stress: Not on file  Social Connections: Not on file  Intimate Partner Violence: Not on file    Outpatient Medications Prior to Visit  Medication Sig Dispense Refill   acetaminophen (TYLENOL) 500 MG tablet Take 500 mg by mouth as needed.     Multiple Vitamins-Minerals (MULTIVITAMIN WITH MINERALS) tablet Take 1 tablet by mouth daily.     NON FORMULARY retna cream     VYLIBRA 0.25-35 MG-MCG tablet TAKE 1 TABLET BY MOUTH EVERY NIGHT AT BEDTIME 84 tablet 0   No facility-administered medications prior to visit.    Allergies  Allergen Reactions   Gardasil [Hpv 4-Valent Vaccine Recombinant Vaccine]     Arm swelling    Review of Systems  Constitutional:  Negative for fever.  HENT:  Negative for congestion.   Eyes:  Negative for blurred vision.  Respiratory:  Negative for cough and shortness of breath.   Cardiovascular:  Negative for chest pain.  Gastrointestinal:  Negative for constipation, diarrhea, nausea and vomiting.   Genitourinary:  Negative for dysuria and urgency.  Musculoskeletal:  Negative for joint pain and myalgias.  Skin:  Negative for rash.  Neurological:  Positive for headaches (has headaches about once a week, advil/tylenol helps).  Psychiatric/Behavioral:         Denies depression/anxiety      Objective:    Physical Exam  BP (!) 126/50 (BP Location: Right Arm, Patient Position: Sitting, Cuff Size: Small)    Pulse 68    Temp 98.5 F (36.9 C) (Oral)    Resp 16    Ht 5\' 5"  (1.651 m)    Wt 171 lb 6.4 oz (77.7 kg)    SpO2 99%    BMI 28.52 kg/m  Wt Readings from Last 3 Encounters:  03/01/21 171 lb 6.4 oz (77.7 kg)  05/18/20 160 lb (72.6 kg)  01/16/20 160 lb 1.3 oz (72.6 kg)   Physical Exam  Constitutional: She is oriented to person, place, and time. She appears well-developed and well-nourished. No distress.  HENT:  Head: Normocephalic and atraumatic.  Right Ear: Tympanic membrane and ear canal normal.  Left Ear: Tympanic membrane and ear canal normal.  Mouth/Throat: Not examined- pt wearing mask Eyes: Pupils are equal, round, and reactive to light. No scleral icterus.  Neck: Normal range of motion. No thyromegaly present.  Cardiovascular: Normal rate and regular rhythm.   No murmur heard. Pulmonary/Chest: Effort normal and breath sounds normal. No respiratory distress. He has no wheezes. She has no rales. She exhibits no tenderness.  Abdominal: Soft. Bowel sounds are normal. She exhibits no distension and no mass. There is no tenderness. There is no rebound and no guarding.  Musculoskeletal: She exhibits no edema.  Lymphadenopathy:    She has no cervical adenopathy.  Neurological: She is alert and oriented to person, place, and time. She has normal patellar reflexes. She exhibits normal muscle tone. Coordination normal.  Skin: Skin is warm and dry.  Psychiatric: She has a normal mood and affect. Her behavior is normal. Judgment and thought content normal.  Breast/pelvic: deferred to  GYN         Assessment & Plan:       Assessment & Plan:   Problem List Items Addressed This Visit       Unprioritized   Preventative health care    Discussed healthy diet and regular exercise.  She is not able to complete the gardisil series due to allergic reaction to the first vaccine. Flu shot is up to date. Recommended that she obtain the bivalent covid booster at her pharmacy.  Pap up to date per GYN.  Check TB gold for her school form.  Will also check a baseline lipid panel.       Relevant Orders   Lipid panel   Other Visit Diagnoses     Screening for tuberculosis    -  Primary   Relevant Orders   QuantiFERON-TB Gold Plus       I am having Belinda Bryan maintain her multivitamin with minerals, NON FORMULARY, acetaminophen, and VyLibra.  No orders of the defined types were placed in this encounter.

## 2021-03-01 NOTE — Assessment & Plan Note (Signed)
Discussed healthy diet and regular exercise.  She is not able to complete the gardisil series due to allergic reaction to the first vaccine. Flu shot is up to date. Recommended that she obtain the bivalent covid booster at her pharmacy.  Pap up to date per GYN.  Check TB gold for her school form. Will also check a baseline lipid panel.

## 2021-03-01 NOTE — Telephone Encounter (Signed)
See pap result in labs.  It did not populate health maintenance. Can you please abstract? tks

## 2021-03-01 NOTE — Patient Instructions (Signed)
Please complete lab work prior to leaving. Good luck with school.

## 2021-03-01 NOTE — Telephone Encounter (Signed)
Results abstracted as "See resuts" for abnormal findings

## 2021-03-05 LAB — QUANTIFERON-TB GOLD PLUS
Mitogen-NIL: >10 IU/mL
NIL: 0.04 IU/mL
QuantiFERON-TB Gold Plus: NEGATIVE
TB1-NIL: 0.01 IU/mL
TB2-NIL: 0.02 IU/mL

## 2021-03-12 NOTE — Telephone Encounter (Signed)
Forms returned to patient today

## 2021-03-12 NOTE — Telephone Encounter (Signed)
pt dropped forms off to be filled out for school. pt would like to know if forms are ready for pick up. please advise.

## 2021-03-25 ENCOUNTER — Ambulatory Visit: Payer: No Typology Code available for payment source | Admitting: Nurse Practitioner

## 2021-05-23 ENCOUNTER — Other Ambulatory Visit (HOSPITAL_BASED_OUTPATIENT_CLINIC_OR_DEPARTMENT_OTHER): Payer: Self-pay

## 2021-05-23 MED ORDER — SPRINTEC 28 0.25-35 MG-MCG PO TABS
1.0000 | ORAL_TABLET | Freq: Every day | ORAL | 3 refills | Status: DC
  Filled 2021-05-23: qty 84, 84d supply, fill #0
  Filled 2021-08-20: qty 84, 84d supply, fill #1
  Filled 2021-11-29: qty 84, 84d supply, fill #2
  Filled 2022-02-24: qty 84, 84d supply, fill #3

## 2021-05-24 ENCOUNTER — Other Ambulatory Visit (HOSPITAL_BASED_OUTPATIENT_CLINIC_OR_DEPARTMENT_OTHER): Payer: Self-pay

## 2021-05-27 ENCOUNTER — Other Ambulatory Visit (HOSPITAL_BASED_OUTPATIENT_CLINIC_OR_DEPARTMENT_OTHER): Payer: Self-pay

## 2021-05-27 ENCOUNTER — Ambulatory Visit: Payer: No Typology Code available for payment source | Admitting: Nurse Practitioner

## 2021-08-07 ENCOUNTER — Telehealth: Payer: No Typology Code available for payment source | Admitting: Physician Assistant

## 2021-08-07 DIAGNOSIS — R3989 Other symptoms and signs involving the genitourinary system: Secondary | ICD-10-CM

## 2021-08-07 MED ORDER — CEPHALEXIN 500 MG PO CAPS: 500.0000 mg | ORAL_CAPSULE | Freq: Two times a day (BID) | ORAL | 0 refills | Status: AC

## 2021-08-07 NOTE — Progress Notes (Signed)

## 2021-08-07 NOTE — Progress Notes (Signed)
I have spent 5 minutes in review of e-visit questionnaire, review and updating patient chart, medical decision making and response to patient.   Antanette Richwine Cody Noelle Hoogland, PA-C    

## 2021-08-19 ENCOUNTER — Telehealth: Payer: No Typology Code available for payment source | Admitting: Physician Assistant

## 2021-08-19 DIAGNOSIS — B379 Candidiasis, unspecified: Secondary | ICD-10-CM | POA: Diagnosis not present

## 2021-08-19 DIAGNOSIS — T3695XA Adverse effect of unspecified systemic antibiotic, initial encounter: Secondary | ICD-10-CM

## 2021-08-19 MED ORDER — FLUCONAZOLE 150 MG PO TABS: 150.0000 mg | ORAL_TABLET | ORAL | 0 refills | Status: DC | PRN

## 2021-08-19 NOTE — Progress Notes (Signed)

## 2021-08-20 ENCOUNTER — Other Ambulatory Visit (HOSPITAL_BASED_OUTPATIENT_CLINIC_OR_DEPARTMENT_OTHER): Payer: Self-pay

## 2021-11-29 ENCOUNTER — Other Ambulatory Visit (HOSPITAL_BASED_OUTPATIENT_CLINIC_OR_DEPARTMENT_OTHER): Payer: Self-pay

## 2021-11-29 ENCOUNTER — Ambulatory Visit (INDEPENDENT_AMBULATORY_CARE_PROVIDER_SITE_OTHER): Payer: No Typology Code available for payment source | Admitting: Family

## 2021-11-29 VITALS — BP 134/65 | HR 99 | Temp 98.4°F | Resp 16 | Wt 170.0 lb

## 2021-11-29 DIAGNOSIS — L700 Acne vulgaris: Secondary | ICD-10-CM

## 2021-11-29 DIAGNOSIS — Z23 Encounter for immunization: Secondary | ICD-10-CM

## 2021-11-29 DIAGNOSIS — L723 Sebaceous cyst: Secondary | ICD-10-CM | POA: Diagnosis not present

## 2021-11-29 DIAGNOSIS — G43109 Migraine with aura, not intractable, without status migrainosus: Secondary | ICD-10-CM | POA: Diagnosis not present

## 2021-11-29 DIAGNOSIS — M5416 Radiculopathy, lumbar region: Secondary | ICD-10-CM

## 2021-11-29 DIAGNOSIS — Z3041 Encounter for surveillance of contraceptive pills: Secondary | ICD-10-CM | POA: Diagnosis not present

## 2021-11-29 DIAGNOSIS — G43909 Migraine, unspecified, not intractable, without status migrainosus: Secondary | ICD-10-CM | POA: Insufficient documentation

## 2021-11-29 MED ORDER — TAZAROTENE 0.1 % EX GEL
Freq: Every day | CUTANEOUS | 2 refills | Status: DC
Start: 2021-11-29 — End: 2023-02-24
  Filled 2021-11-29: qty 30, 30d supply, fill #0

## 2021-11-29 MED ORDER — SUMATRIPTAN SUCCINATE 50 MG PO TABS
50.0000 mg | ORAL_TABLET | ORAL | 3 refills | Status: DC | PRN
  Filled 2021-11-29: qty 10, 1d supply, fill #0
  Filled 2021-12-05: qty 10, 20d supply, fill #0
  Filled 2022-02-24: qty 10, 20d supply, fill #1

## 2021-11-29 MED ORDER — PROPRANOLOL HCL 20 MG PO TABS
20.0000 mg | ORAL_TABLET | Freq: Two times a day (BID) | ORAL | 1 refills | Status: DC
  Filled 2021-11-29: qty 60, 30d supply, fill #0

## 2021-11-29 NOTE — Assessment & Plan Note (Signed)
Uncontrolled. Will give trial of propranolol 20mg  bid for migraine prophylaxis and add imitrex 50mg  PO PRN migraine for rescue.

## 2021-11-29 NOTE — Assessment & Plan Note (Signed)
Uncontrolled. Restart tazarac which has been helpful for her I the past.

## 2021-11-29 NOTE — Assessment & Plan Note (Signed)
Small, she would eventually like dermatology to look at this, but she is not ready until she is on a new insurance and will lt me know.

## 2021-11-29 NOTE — Assessment & Plan Note (Signed)
She has pain radiating from the back of her hip and wrapping around to the front and down her anterior thigh.  Reviewed MRI from 2019-

## 2021-11-29 NOTE — Progress Notes (Signed)
Subjective:   By signing my name below, I, Belinda Bryan, attest that this documentation has been prepared under the direction and in the presence of Alma Downs' Suvillivan, NP 11/29/2021   Patient ID: Belinda Bryan, female    DOB: Jan 15, 1996, 26 y.o.   MRN: [id number]  Chief Complaint  Patient presents with   Headache    Complains of daily headaches   Mass    Very small lump in back   Acne    Complains of acne    HPI Patient is in today for an office visit  Headaches: She complains of headaches that occur daily. Symptoms occur around midday to the evening and starts at the back of her head and moves to the front. When symptoms do occur, dark environments helps alleviate symptoms. When symptoms worsen, she experiences loss of vision in her right eye. She occasionally takes Advil, Goody's Powder, and drinks water which alleviate some of her symptoms. Her headaches dissipates on its own. She is currently taking 0.25-35 Mg-Mcg of Sprintec 28.   Acne and Cyst: She used to regularly go to a dermatologist but due to travel, is not convent for her. When she previously went to dermatologist for her acne, she was put on [place] but was changed to Sprintec per her gynecologist recommendation. She was also using Tazorac cream for spot treatment. She is interested in being referred to a dermatologist. She currently lives in [place], [place] however her insurance does not cover dermatologist in her area at the moment.   Left Hip Pain: She was sent to an orthopedic for her symptoms. She has received a cortisone injection, physical therapist and has done in MRI in the area and reports that symptoms are persistent. She has a history of minor scoliosis. Her hip pain starts in her buttock and radiates down her left leg. Her symptoms appear about several times a week. She has previously taken Prednisone but it did not improve symptoms.   Immunizations: She is UTD on her influenza vaccines.   Health  Maintenance Due  Topic Date Due   HIV Screening  Never done   HPV VACCINES (2 - 3-dose series) 05/10/2015   Hepatitis C Screening  Never done   COVID-19 Vaccine (3 - Pfizer series) 09/05/2019   CHLAMYDIA SCREENING  05/18/2021    Past Medical History:  Diagnosis Date   Acne 07/16/2014   Cardiac murmur 12/02/2019   Decreased hearing of right ear 07/16/2014   Left arm weakness 05/17/2014   Formatting of this note might be different from the original. 26 yo female referred to Dr. Glenna Durand by [facility], Dr. Hennie Duos. Willis for thoracic outlet syndrome, left.  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   Thoracic outlet syndrome 05/17/2014   Trochanteric bursitis of left hip 03/15/2018    Past Surgical History:  Procedure Laterality Date   THORACIC OUTLET SURGERY      Family History  Problem Relation Age of Onset   Hypertension Maternal Grandmother    Hyperlipidemia Maternal Grandmother    COPD Paternal Grandmother    Heart block Paternal Grandmother    Hyperlipidemia Paternal Grandfather    Sudden death Neg Hx  Heart attack Neg Hx     Social History   Socioeconomic History   Marital status: Single    Spouse name: Not on file   Number of children: Not on file   Years of education: Not on file   Highest education level: Not on file  Occupational History   Occupation: student  Tobacco Use   Smoking status: Never   Smokeless tobacco: Never  Vaping Use   Vaping Use: Never used  Substance and Sexual Activity   Alcohol use: Yes    Alcohol/week: 1.0 standard drink of alcohol    Types: 1 Standard drinks or equivalent per week   Drug use: No   Sexual activity: Yes     Partners: Male    Birth control/protection: Condom, Pill  Other Topics Concern   Not on file  Social History Narrative   Patient is right handed.   Patient drinks 1 cup of caffeine daily.   Will Attend an OT program    Has one older brother   Has a dog   Social Determinants of Health   Financial Resource Strain: Not on file  Food Insecurity: Not on file  Transportation Needs: Not on file  Physical Activity: Not on file  Stress: Not on file  Social Connections: Not on file  Intimate Partner Violence: Not on file    Outpatient Medications Prior to Visit  Medication Sig Dispense Refill   acetaminophen (TYLENOL) 500 MG tablet Take 500 mg by mouth as needed.     Multiple Vitamins-Minerals (MULTIVITAMIN WITH MINERALS) tablet Take 1 tablet by mouth daily.     NON FORMULARY retna cream     SPRINTEC 28 0.25-35 MG-MCG tablet Take 1 tablet by mouth once a day 84 tablet 3   fluconazole (DIFLUCAN) 150 MG tablet Take 1 tablet (150 mg total) by mouth every 3 (three) days as needed. 2 tablet 0   No facility-administered medications prior to visit.    Allergies  Allergen Reactions   Gardasil [Hpv 4-Valent Vaccine Recombinant Vaccine]     Arm swelling    Review of Systems  Skin:        (+) Acne (+) Cyst Left Back  Neurological:  Positive for headaches.       Objective:    Physical Exam Constitutional:      General: She is not in acute distress.    Appearance: Normal appearance. She is not ill-appearing.  HENT:     Head: Normocephalic and atraumatic.     Right Ear: External ear normal.     Left Ear: External ear normal.  Eyes:     Extraocular Movements: Extraocular movements intact.     Pupils: Pupils are equal, round, and reactive to light.  Cardiovascular:     Rate and Rhythm: Normal rate and regular rhythm.     Heart sounds: Normal heart sounds. No murmur heard.    No gallop.  Pulmonary:     Effort: Pulmonary effort is normal. No respiratory distress.     Breath  sounds: Normal breath sounds. No wheezing or rales.  Skin:    General: Skin is warm and dry.     Comments: Pea size subcutaneous cyst left back   Neurological:     Mental Status: She is alert and oriented to person, place, and time.  Psychiatric:        Mood and Affect: Mood normal.        Behavior: Behavior normal.        Judgment:  Judgment normal.     BP 134/65 (BP Location: Right Arm, Patient Position: Sitting, Cuff Size: Small)   Pulse 99   Temp 98.4 F (36.9 C) (Oral)   Resp 16   Wt 170 lb (77.1 kg)   SpO2 100%   BMI 28.29 kg/m  Wt Readings from Last 3 Encounters:  11/29/21 170 lb (77.1 kg)  03/01/21 171 lb 6.4 oz (77.7 kg)  05/18/20 160 lb (72.6 kg)       Assessment & Plan:   Problem List Items Addressed This Visit       Unprioritized   Sebaceous cyst    Small, she would eventually like dermatology to look at this, but she is not ready until she is on a new insurance and will lt me know.       Migraine with aura    Uncontrolled. Will give trial of propranolol 20mg  bid for migraine prophylaxis and add imitrex 50mg  PO PRN migraine for rescue.       Relevant Medications   propranolol (INDERAL) 20 MG tablet   SUMAtriptan (IMITREX) 50 MG tablet   Lumbar radiculopathy    She has pain radiating from the back of her hip and wrapping around to the front and down her anterior thigh.  Reviewed MRI from 2019-      Relevant Orders   Ambulatory referral to Orthopedics   Encounter for surveillance of contraceptive pills    I advised pt to reach out to her gyn and let them know that she is having migraines with aura so that they can consider changing her to an estrogen free birth control.       Acne vulgaris    Uncontrolled. Restart tazarac which has been helpful for her I the past.       Other Visit Diagnoses     Needs flu shot    -  Primary   Relevant Orders   Flu Vaccine QUAD 6+ mos PF IM (Fluarix Quad PF) (Completed)      Meds ordered this encounter   Medications   propranolol (INDERAL) 20 MG tablet    Sig: Take 1 tablet (20 mg total) by mouth 2 (two) times daily.    Dispense:  60 tablet    Refill:  1    Order Specific Question:   Supervising Provider    Answer:   Penni Homans A [[id number]]   SUMAtriptan (IMITREX) 50 MG tablet    Sig: Take 1 tablet (50 mg total) by mouth every 2 (two) hours as needed for migraine. May repeat in 2 hours if headache persists or recurs.    Dispense:  10 tablet    Refill:  3    Order Specific Question:   Supervising Provider    Answer:   Penni Homans A [[id number]]   tazarotene (TAZORAC) 0.1 % gel    Sig: Apply topically at bedtime.    Dispense:  30 g    Refill:  2    Order Specific Question:   Supervising Provider    Answer:   Penni Homans A [[id number]]    I, [name], NP, personally preformed the services described in this documentation.  All medical record entries made by the scribe were at my direction and in my presence.  I have reviewed the chart and discharge instructions (if applicable) and agree that the record reflects my personal performance and is accurate and complete. 11/29/2021   I,Amber Collins,acting as a Education administrator for [employer], NP.,have documented all  relevant documentation on the behalf of [name], NP,as directed by  [name], NP while in the presence of [name], NP.    [name], NP

## 2021-11-29 NOTE — Assessment & Plan Note (Signed)
I advised pt to reach out to her gyn and let them know that she is having migraines with aura so that they can consider changing her to an estrogen free birth control.

## 2021-12-02 ENCOUNTER — Other Ambulatory Visit (HOSPITAL_BASED_OUTPATIENT_CLINIC_OR_DEPARTMENT_OTHER): Payer: Self-pay

## 2021-12-03 ENCOUNTER — Other Ambulatory Visit (HOSPITAL_BASED_OUTPATIENT_CLINIC_OR_DEPARTMENT_OTHER): Payer: Self-pay

## 2021-12-05 ENCOUNTER — Other Ambulatory Visit (HOSPITAL_BASED_OUTPATIENT_CLINIC_OR_DEPARTMENT_OTHER): Payer: Self-pay

## 2021-12-12 ENCOUNTER — Encounter: Payer: Self-pay | Admitting: Family

## 2021-12-12 DIAGNOSIS — M5416 Radiculopathy, lumbar region: Secondary | ICD-10-CM

## 2021-12-12 NOTE — Telephone Encounter (Signed)
Disregard message to you.  Dr. Patrice Paradise office does not accept focus plan.

## 2021-12-12 NOTE — Addendum Note (Signed)
Addended by: [name] on: 12/12/2021 01:35 PM   Modules accepted: Orders

## 2021-12-27 ENCOUNTER — Telehealth (INDEPENDENT_AMBULATORY_CARE_PROVIDER_SITE_OTHER): Payer: No Typology Code available for payment source | Admitting: Family

## 2021-12-27 DIAGNOSIS — L723 Sebaceous cyst: Secondary | ICD-10-CM

## 2021-12-27 DIAGNOSIS — L700 Acne vulgaris: Secondary | ICD-10-CM | POA: Diagnosis not present

## 2021-12-27 DIAGNOSIS — G43109 Migraine with aura, not intractable, without status migrainosus: Secondary | ICD-10-CM | POA: Diagnosis not present

## 2021-12-27 DIAGNOSIS — M5416 Radiculopathy, lumbar region: Secondary | ICD-10-CM

## 2021-12-27 MED ORDER — PROPRANOLOL HCL 20 MG PO TABS: 10.0000 mg | ORAL_TABLET | Freq: Two times a day (BID) | ORAL | 1 refills | Status: DC

## 2021-12-27 NOTE — Assessment & Plan Note (Signed)
Frequency of migraines is improved since addition of propranolol, but she has had some dizziness.  Recommended that she try cutting propranolol 20mg  in half and take 10mg  twice daily to see if this will still keep her migraines at [place] while decreasing dizziness.

## 2021-12-27 NOTE — Assessment & Plan Note (Signed)
Unchanged- pt has appointment scheduled with neurosurgery.

## 2021-12-27 NOTE — Progress Notes (Signed)
MyChart Video Visit    Virtual Visit via Video Note   This visit type was conducted due to national recommendations for restrictions regarding the COVID-19 Pandemic (e.g. social distancing) in an effort to limit this patient's exposure and mitigate transmission in our community. This patient is at least at moderate risk for complications without adequate follow up. This format is felt to be most appropriate for this patient at this time. Physical exam was limited by quality of the video and audio technology used for the visit. CMA was able to get the patient set up on a video visit.  Patient location: Home Patient and provider in visit Provider location: Office  I discussed the limitations of evaluation and management by telemedicine and the availability of in person appointments. The patient expressed understanding and agreed to proceed.  Visit Date: 12/27/2021  Today's healthcare provider: Lemont Fillers, NP     Subjective:    Patient ID: Belinda Bryan, female    DOB: Nov 19, 1995, 26 y.o.   MRN: [id number]  Chief Complaint  Patient presents with   Migraine    Better on medication.    Acne    "About the sme"    HPI Patient is in today for a virtual office visit  Cyst: She states that she decided not go to the dermatologist she originally planned to go to. She is requesting a referral to an alternative dermatologist clinic.   Migraine: Since last visit, she has started her 20 Mg of Propranolol she reports of daily dizziness. However, she states that the medication is improving her migraine symptoms  Hip/Thigh Pain: She states that symptoms are persistent. She reports that she has an appointment scheduled with a neurosurgery in the near future.   Tazorec: She states that in other areas, the gel is improving. However, symptoms worsen on her forehead.   Past Medical History:  Diagnosis Date   Acne 07/16/2014   Cardiac murmur 12/02/2019   Decreased hearing of right  ear 07/16/2014   Left arm weakness 05/17/2014   Formatting of this note might be different from the original. 26 yo female referred to Dr. Glenna Durand by [facility], Dr. Hennie Duos. Willis for thoracic outlet syndrome, left.  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   Thoracic outlet syndrome 05/17/2014   Trochanteric bursitis of left hip 03/15/2018    Past Surgical History:  Procedure Laterality Date   THORACIC OUTLET SURGERY      Family History  Problem Relation Age of Onset   Hypertension Maternal Grandmother    Hyperlipidemia Maternal Grandmother    COPD Paternal Grandmother    Heart block Paternal Grandmother    Hyperlipidemia Paternal Grandfather    Sudden death Neg Hx    Heart attack Neg Hx     Social History   Socioeconomic History   Marital status: Single    Spouse name: Not on file   Number of children: Not on file   Years of education: Not on file   Highest education level: Not on file  Occupational History   Occupation: student  Tobacco Use   Smoking status: Never   Smokeless tobacco: Never  Vaping Use   Vaping  Use: Never used  Substance and Sexual Activity   Alcohol use: Yes    Alcohol/week: 1.0 standard drink of alcohol    Types: 1 Standard drinks or equivalent per week   Drug use: No   Sexual activity: Yes    Partners: Male    Birth control/protection: Condom, Pill  Other Topics Concern   Not on file  Social History Narrative   Patient is right handed.   Patient drinks 1 cup of caffeine daily.   Will Attend an OT program    Has one older brother   Has a dog   Social Determinants of Health   Financial Resource Strain: Not on file  Food Insecurity:  Not on file  Transportation Needs: Not on file  Physical Activity: Not on file  Stress: Not on file  Social Connections: Not on file  Intimate Partner Violence: Not on file    Outpatient Medications Prior to Visit  Medication Sig Dispense Refill   acetaminophen (TYLENOL) 500 MG tablet Take 500 mg by mouth as needed.     Multiple Vitamins-Minerals (MULTIVITAMIN WITH MINERALS) tablet Take 1 tablet by mouth daily.     NON FORMULARY retna cream     SPRINTEC 28 0.25-35 MG-MCG tablet Take 1 tablet by mouth once a day 84 tablet 3   SUMAtriptan (IMITREX) 50 MG tablet Take 1 tablet (50 mg total) by mouth every 2 (two) hours as needed for migraine. May repeat in 2 hours if headache persists or recurs. 10 tablet 3   tazarotene (TAZORAC) 0.1 % gel Apply topically at bedtime. 30 g 2   propranolol (INDERAL) 20 MG tablet Take 1 tablet (20 mg total) by mouth 2 (two) times daily. 60 tablet 1   No facility-administered medications prior to visit.    Allergies  Allergen Reactions   Gardasil [Hpv 4-Valent Vaccine Recombinant Vaccine]     Arm swelling    ROS See HPI    Objective:    Physical Exam  There were no vitals taken for this visit. Wt Readings from Last 3 Encounters:  11/29/21 170 lb (77.1 kg)  03/01/21 171 lb 6.4 oz (77.7 kg)  05/18/20 160 lb (72.6 kg)    Gen: Awake, alert, no acute distress Resp: Breathing is even and non-labored Psych: calm/pleasant demeanor Neuro: Alert and Oriented x 3, + facial symmetry, speech is clear.     Assessment & Plan:   Problem List Items Addressed This Visit       Unprioritized   Sebaceous cyst - Primary   Relevant Orders   Ambulatory referral to Dermatology   Migraine with aura    Frequency of migraines is improved since addition of propranolol, but she has had some dizziness.  Recommended that she try cutting propranolol 20mg  in half and take 10mg  twice daily to see if this will still keep her migraines at [place] while decreasing dizziness.        Relevant Medications   propranolol (INDERAL) 20 MG tablet   Lumbar radiculopathy    Unchanged- pt has appointment scheduled with neurosurgery.        Acne vulgaris    Some improvement with tazorac.  Referral placed to dermatology.       Relevant Orders   Ambulatory referral to Dermatology   Meds ordered this encounter  Medications   propranolol (INDERAL) 20 MG tablet    Sig: Take 0.5 tablets (10 mg total) by mouth 2 (two) times daily.    Dispense:  60 tablet  Refill:  1    Order Specific Question:   Supervising Provider    Answer:   Danise Edge A [[id number]]    I discussed the assessment and treatment plan with the patient. The patient was provided an opportunity to ask questions and all were answered. The patient agreed with the plan and demonstrated an understanding of the instructions.   The patient was advised to call back or seek an in-person evaluation if the symptoms worsen or if the condition fails to improve as anticipated.  I provided 20 minutes of face-to-face time during this encounter.   I,Amber Collins,acting as a Neurosurgeon for [employer], NP.,have documented all relevant documentation on the behalf of Lemont Fillers, NP,as directed by  Lemont Fillers, NP while in the presence of Lemont Fillers, NP.   Lemont Fillers, NP [employer] [facility] [employer] [phone] (phone) [phone] (fax)  [facility]

## 2021-12-27 NOTE — Assessment & Plan Note (Signed)
Some improvement with tazorac.  Referral placed to dermatology.

## 2022-01-03 ENCOUNTER — Ambulatory Visit: Payer: No Typology Code available for payment source | Admitting: Orthopedic Surgery

## 2022-02-25 ENCOUNTER — Other Ambulatory Visit: Payer: Self-pay

## 2022-02-25 ENCOUNTER — Other Ambulatory Visit (HOSPITAL_BASED_OUTPATIENT_CLINIC_OR_DEPARTMENT_OTHER): Payer: Self-pay

## 2022-02-26 ENCOUNTER — Other Ambulatory Visit (HOSPITAL_BASED_OUTPATIENT_CLINIC_OR_DEPARTMENT_OTHER): Payer: Self-pay

## 2022-02-28 ENCOUNTER — Ambulatory Visit (INDEPENDENT_AMBULATORY_CARE_PROVIDER_SITE_OTHER): Payer: No Typology Code available for payment source | Admitting: Family

## 2022-02-28 ENCOUNTER — Encounter: Payer: Self-pay | Admitting: Family

## 2022-02-28 ENCOUNTER — Telehealth: Payer: Self-pay | Admitting: Family

## 2022-02-28 VITALS — BP 121/55 | HR 70 | Temp 98.3°F | Resp 16 | Wt 172.0 lb

## 2022-02-28 DIAGNOSIS — G43909 Migraine, unspecified, not intractable, without status migrainosus: Secondary | ICD-10-CM

## 2022-02-28 NOTE — Progress Notes (Signed)
Subjective:   By signing my name below, I, [name], attest that this documentation has been prepared under the direction and in the presence of Alma Downs' Suvillivan, NP 02/28/2022     Patient ID: Belinda Bryan, female    DOB: 13-Aug-1995, 26 y.o.   MRN: [id number]  Chief Complaint  Patient presents with   Migraine    Here for follow up    HPI Patient is in today for an office visit  Migraines: Since last visit, she was prescribed 0.5 tablets of 20 mg of Propranolol. She states after taking the medication, her dizziness and nausea persisted therefore, she stopped taking the medication. She has been off of the medication since [facility] November. She reports that her migraines are now about twice a month but headaches are daily. When she was taking the Propranolol her headaches were not daily but a few times a week. She states that her 50 mg of Imitrex alleviates her migraines but does occasionally tightens her jaw.  Left Hip: She reports that she has an appointment scheduled for her left hip on 03/12/2022.  Health Maintenance Due  Topic Date Due   HIV Screening  Never done   HPV VACCINES (2 - 3-dose series) 05/10/2015   Hepatitis C Screening  Never done   CHLAMYDIA SCREENING  05/18/2021   COVID-19 Vaccine (3 - 2023-24 season) 11/08/2021   PAP-Cervical Cytology Screening  04/18/2022   PAP SMEAR-Modifier  04/18/2022    Past Medical History:  Diagnosis Date   Acne 07/16/2014   Cardiac murmur 12/02/2019   Decreased hearing of right ear 07/16/2014   Left arm weakness 05/17/2014   Formatting of this note might be different from the original. 26 yo female referred to Dr. Glenna Durand by [facility], Dr. Hennie Duos. Willis for thoracic outlet syndrome, left.  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   Thoracic outlet syndrome 05/17/2014   Trochanteric bursitis of left hip 03/15/2018    Past Surgical History:  Procedure Laterality Date   THORACIC OUTLET SURGERY      Family History  Problem Relation Age of Onset   Hypertension Maternal Grandmother    Hyperlipidemia Maternal Grandmother    COPD Paternal Grandmother    Heart block Paternal Grandmother    Hyperlipidemia Paternal Grandfather    Sudden death Neg Hx    Heart attack Neg Hx     Social History   Socioeconomic History   Marital status: Single    Spouse name: Not on file   Number of children: Not on file   Years of education: Not on file   Highest education level: Not on file  Occupational History   Occupation: student  Tobacco Use   Smoking status: Never   Smokeless tobacco: Never  Vaping Use   Vaping Use: Never used  Substance and Sexual Activity   Alcohol use: Yes    Alcohol/week: 1.0 standard drink of alcohol    Types: 1 Standard drinks or equivalent per week   Drug use: No   Sexual activity: Yes    Partners: Male    Birth control/protection: Condom, Pill  Other Topics Concern   Not on file  Social History Narrative  Patient is right handed.   Patient drinks 1 cup of caffeine daily.   Will Attend an OT program    Has one older brother   Has a dog   Social Determinants of Health   Financial Resource Strain: Not on file  Food Insecurity: Not on file  Transportation Needs: Not on file  Physical Activity: Not on file  Stress: Not on file  Social Connections: Not on file  Intimate Partner Violence: Not on file    Outpatient Medications Prior to Visit  Medication Sig Dispense Refill   acetaminophen (TYLENOL) 500 MG tablet Take 500 mg by mouth as needed.     Multiple Vitamins-Minerals (MULTIVITAMIN WITH MINERALS) tablet Take 1 tablet by mouth daily.     NON FORMULARY retna cream      SPRINTEC 28 0.25-35 MG-MCG tablet Take 1 tablet by mouth once a day 84 tablet 3   SUMAtriptan (IMITREX) 50 MG tablet Take 1 tablet (50 mg total) by mouth every 2 (two) hours as needed for migraine. May repeat in 2 hours if headache persists or recurs. 10 tablet 3   tazarotene (TAZORAC) 0.1 % gel Apply topically at bedtime. 30 g 2   propranolol (INDERAL) 20 MG tablet Take 0.5 tablets (10 mg total) by mouth 2 (two) times daily. 60 tablet 1   No facility-administered medications prior to visit.    Allergies  Allergen Reactions   Gardasil [Hpv 4-Valent Vaccine Recombinant Vaccine]     Arm swelling    ROS See HPI    Objective:    Physical Exam Constitutional:      General: She is not in acute distress.    Appearance: Normal appearance. She is not ill-appearing.  HENT:     Head: Normocephalic and atraumatic.     Right Ear: External ear normal.     Left Ear: External ear normal.  Eyes:     Extraocular Movements: Extraocular movements intact.     Pupils: Pupils are equal, round, and reactive to light.  Cardiovascular:     Rate and Rhythm: Normal rate and regular rhythm.     Heart sounds: Normal heart sounds. No murmur heard.    No gallop.  Pulmonary:     Effort: Pulmonary effort is normal. No respiratory distress.     Breath sounds: Normal breath sounds. No wheezing or rales.  Skin:    General: Skin is warm and dry.  Neurological:     Mental Status: She is alert and oriented to person, place, and time.  Psychiatric:        Mood and Affect: Mood normal.        Behavior: Behavior normal.        Judgment: Judgment normal.     BP (!) 121/55 (BP Location: Right Arm, Patient Position: Sitting, Cuff Size: Small)   Pulse 70   Temp 98.3 F (36.8 C) (Oral)   Resp 16   Wt 172 lb (78 kg)   SpO2 99%   BMI 28.62 kg/m  Wt Readings from Last 3 Encounters:  02/28/22 172 lb (78 kg)  11/29/21 170 lb (77.1 kg)  03/01/21 171 lb 6.4 oz (77.7 kg)       Assessment & Plan:    Problem List Items Addressed This Visit       Unprioritized   Migraine without status migrainosus, not intractable - Primary    Migraines are occurring 2 x a month and resolve with prn imitrex. She did not tolerate propranolol due to dizziness  so she discontinued. I am concerned about her daily headaches. Will refer to neurology for further evluation.       Relevant Orders   Ambulatory referral to Neurology   No orders of the defined types were placed in this encounter.   I, Lemont Fillers, NP, personally preformed the services described in this documentation.  All medical record entries made by the scribe were at my direction and in my presence.  I have reviewed the chart and discharge instructions (if applicable) and agree that the record reflects my personal performance and is accurate and complete. 02/28/2022   I,Amber Collins,acting as a scribe for Lemont Fillers, NP.,have documented all relevant documentation on the behalf of Lemont Fillers, NP,as directed by  Lemont Fillers, NP while in the presence of Lemont Fillers, NP.    Lemont Fillers, NP

## 2022-02-28 NOTE — Assessment & Plan Note (Signed)
Migraines are occurring 2 x a month and resolve with prn imitrex. She did not tolerate propranolol due to dizziness so she discontinued. I am concerned about her daily headaches. Will refer to neurology for further evluation.

## 2022-02-28 NOTE — Telephone Encounter (Signed)
See mychart.  

## 2022-03-04 ENCOUNTER — Encounter: Payer: Self-pay | Admitting: Neurology

## 2022-03-11 ENCOUNTER — Other Ambulatory Visit (HOSPITAL_BASED_OUTPATIENT_CLINIC_OR_DEPARTMENT_OTHER): Payer: Self-pay

## 2022-03-11 DIAGNOSIS — L700 Acne vulgaris: Secondary | ICD-10-CM | POA: Diagnosis not present

## 2022-03-11 MED ORDER — WINLEVI 1 % EX CREA
TOPICAL_CREAM | CUTANEOUS | 0 refills | Status: DC
  Filled 2022-03-11: qty 60, 30d supply, fill #0

## 2022-03-12 DIAGNOSIS — M5136 Other intervertebral disc degeneration, lumbar region: Secondary | ICD-10-CM | POA: Diagnosis not present

## 2022-03-12 DIAGNOSIS — M41125 Adolescent idiopathic scoliosis, thoracolumbar region: Secondary | ICD-10-CM | POA: Diagnosis not present

## 2022-03-13 ENCOUNTER — Other Ambulatory Visit (HOSPITAL_BASED_OUTPATIENT_CLINIC_OR_DEPARTMENT_OTHER): Payer: Self-pay

## 2022-03-25 ENCOUNTER — Other Ambulatory Visit (HOSPITAL_BASED_OUTPATIENT_CLINIC_OR_DEPARTMENT_OTHER): Payer: Self-pay

## 2022-03-27 ENCOUNTER — Other Ambulatory Visit (HOSPITAL_BASED_OUTPATIENT_CLINIC_OR_DEPARTMENT_OTHER): Payer: Self-pay

## 2022-03-27 ENCOUNTER — Other Ambulatory Visit: Payer: Self-pay

## 2022-04-02 ENCOUNTER — Other Ambulatory Visit (HOSPITAL_BASED_OUTPATIENT_CLINIC_OR_DEPARTMENT_OTHER): Payer: Self-pay

## 2022-04-02 ENCOUNTER — Other Ambulatory Visit: Payer: Self-pay

## 2022-04-22 ENCOUNTER — Encounter: Payer: Self-pay | Admitting: Family

## 2022-04-23 ENCOUNTER — Other Ambulatory Visit (HOSPITAL_BASED_OUTPATIENT_CLINIC_OR_DEPARTMENT_OTHER): Payer: Self-pay

## 2022-04-23 ENCOUNTER — Telehealth (INDEPENDENT_AMBULATORY_CARE_PROVIDER_SITE_OTHER): Payer: 59 | Admitting: Family

## 2022-04-23 DIAGNOSIS — F32A Depression, unspecified: Secondary | ICD-10-CM | POA: Diagnosis not present

## 2022-04-23 DIAGNOSIS — F419 Anxiety disorder, unspecified: Secondary | ICD-10-CM

## 2022-04-23 MED ORDER — BUPROPION HCL ER (XL) 150 MG PO TB24
150.0000 mg | ORAL_TABLET | Freq: Every day | ORAL | 0 refills | Status: DC
  Filled 2022-04-23: qty 30, 30d supply, fill #0

## 2022-04-23 NOTE — Assessment & Plan Note (Addendum)
I advised her that I think an SSRI would be the most beneficial class of medication for her.  She has done a lot of research on her own and wishes to try wellbutrin first. If this does not help, then she would consider an SSRI at a later date. Discussed that wellbutrin is more effective for depression symptoms than anxiety. She is actively working with a therapist.  Plan trial of wellbutrin and follow up in 1 month.

## 2022-04-23 NOTE — Progress Notes (Signed)
MyChart Video Visit    Virtual Visit via Video Note   This visit type was conducted due to national recommendations for restrictions regarding the COVID-19 Pandemic (e.g. social distancing) in an effort to limit this patient's exposure and mitigate transmission in our community. This patient is at least at moderate risk for complications without adequate follow up. This format is felt to be most appropriate for this patient at this time. Physical exam was limited by quality of the video and audio technology used for the visit. CMA was able to get the patient set up on a video visit.  Patient location: Home Patient and provider in visit Provider location: Office  I discussed the limitations of evaluation and management by telemedicine and the availability of in person appointments. The patient expressed understanding and agreed to proceed.  Visit Date: 04/23/2022  Today's healthcare provider: [name], NP     Subjective:    Patient ID: Belinda Bryan, female    DOB: [birth year], 27 y.o.   MRN: [id number]  No chief complaint on file.   HPI Patient is in today for video visit.   Depression and anxiety: She has been in and out of therapy for the past for her mood but did not take any medication. She has anxiety daily. When she gets too anxious she grabs her hand to try to calm her down. She has been diagnosed with depression. She has difficulty motivating herself. She can lay in bed most of the weekend due to having no tasks to complete. She is requesting to start Wellbutrin.   Hip pain: She has seen a neurosurgeon for her hip pain and found no new issues. States that she wishes to put the hip issue on the back burner.    Past Medical History:  Diagnosis Date   Acne 07/16/2014   Cardiac murmur 12/02/2019   Decreased hearing of right ear 07/16/2014   Left arm weakness 05/17/2014   Formatting of this note might be different from the original. 27 yo female referred to Dr.  Williemae Natter by [facility], Dr. Elon Alas. Willis for thoracic outlet syndrome, left.  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   Thoracic outlet syndrome 05/17/2014   Trochanteric bursitis of left hip 03/15/2018    Past Surgical History:  Procedure Laterality Date   THORACIC OUTLET SURGERY      Family History  Problem Relation Age of Onset   Hypertension Maternal Grandmother    Hyperlipidemia Maternal Grandmother    COPD Paternal Grandmother    Heart block Paternal Grandmother    Hyperlipidemia Paternal Grandfather    Sudden death Neg Hx    Heart attack Neg Hx     Social History   Socioeconomic History   Marital status: Single    Spouse name: Not on file   Number of children: Not on file   Years of education: Not on file   Highest education level: Not on file  Occupational History   Occupation: student  Tobacco Use   Smoking status: Never   Smokeless tobacco: Never  Vaping Use   Vaping Use: Never used  Substance and Sexual Activity   Alcohol use: Yes  Alcohol/week: 1.0 standard drink of alcohol    Types: 1 Standard drinks or equivalent per week   Drug use: No   Sexual activity: Yes    Partners: Male    Birth control/protection: Condom, Pill  Other Topics Concern   Not on file  Social History Narrative   Patient is right handed.   Patient drinks 1 cup of caffeine daily.   Will Attend an OT program    Has one older brother   Has a dog   Social Determinants of Health   Financial Resource Strain: Not on file  Food Insecurity: Not on file  Transportation Needs: Not on file  Physical Activity: Not on file  Stress: Not on file  Social Connections: Not on file   Intimate Partner Violence: Not on file    Outpatient Medications Prior to Visit  Medication Sig Dispense Refill   acetaminophen (TYLENOL) 500 MG tablet Take 500 mg by mouth as needed.     Clascoterone (WINLEVI) 1 % CREA Apply a small amount to affected skin once a day 60 g 0   Multiple Vitamins-Minerals (MULTIVITAMIN WITH MINERALS) tablet Take 1 tablet by mouth daily.     NON FORMULARY retna cream     SPRINTEC 28 0.25-35 MG-MCG tablet Take 1 tablet by mouth once a day 84 tablet 3   SUMAtriptan (IMITREX) 50 MG tablet Take 1 tablet (50 mg total) by mouth every 2 (two) hours as needed for migraine. May repeat in 2 hours if headache persists or recurs. 10 tablet 3   tazarotene (TAZORAC) 0.1 % gel Apply topically at bedtime. 30 g 2   No facility-administered medications prior to visit.    Allergies  Allergen Reactions   Gardasil [Hpv 4-Valent Vaccine Recombinant Vaccine]     Arm swelling    Review of Systems  Psychiatric/Behavioral:  Positive for depression. The patient is nervous/anxious.        Objective:    Physical Exam  There were no vitals taken for this visit. Wt Readings from Last 3 Encounters:  02/28/22 172 lb (78 kg)  11/29/21 170 lb (77.1 kg)  03/01/21 171 lb 6.4 oz (77.7 kg)    Gen: Awake, alert, no acute distress Resp: Breathing is even and non-labored Psych: calm/pleasant demeanor Neuro: Alert and Oriented x 3, + facial symmetry, speech is clear.     Assessment & Plan:  Anxiety and depression Assessment & Plan: I advised her that I think an SSRI would be the most beneficial class of medication for her.  She has done a lot of research on her own and wishes to try wellbutrin first. If this does not help, then she would consider an SSRI at a later date. Discussed that wellbutrin is more effective for depression symptoms than anxiety. She is actively working with a therapist.  Plan trial of wellbutrin and follow up in 1 month.    Other orders -     buPROPion  HCl ER (XL); Take 1 tablet (150 mg total) by mouth daily.  Dispense: 30 tablet; Refill: 0     I discussed the assessment and treatment plan with the patient. The patient was provided an opportunity to ask questions and all were answered. The patient agreed with the plan and demonstrated an understanding of the instructions.   The patient was advised to call back or seek an in-person evaluation if the symptoms worsen or if the condition fails to improve as anticipated.  [name], NP [facility] [phone] (phone) [phone] (fax)  [place]    I,Shehryar Baig,acting as a scribe for [name], NP.,have documented all relevant documentation on the behalf of [name], NP,as directed by  [name], NP while in the presence of [name], NP.

## 2022-06-03 ENCOUNTER — Other Ambulatory Visit (HOSPITAL_BASED_OUTPATIENT_CLINIC_OR_DEPARTMENT_OTHER): Payer: Self-pay

## 2022-06-03 ENCOUNTER — Ambulatory Visit (INDEPENDENT_AMBULATORY_CARE_PROVIDER_SITE_OTHER): Payer: 59 | Admitting: Family

## 2022-06-03 VITALS — BP 118/61 | HR 69 | Temp 98.0°F | Resp 16 | Wt 172.0 lb

## 2022-06-03 DIAGNOSIS — F419 Anxiety disorder, unspecified: Secondary | ICD-10-CM | POA: Diagnosis not present

## 2022-06-03 DIAGNOSIS — F32A Depression, unspecified: Secondary | ICD-10-CM | POA: Diagnosis not present

## 2022-06-03 MED ORDER — BUPROPION HCL ER (XL) 150 MG PO TB24
150.0000 mg | ORAL_TABLET | Freq: Every day | ORAL | 0 refills | Status: DC
  Filled 2022-06-03: qty 90, 90d supply, fill #0

## 2022-06-03 NOTE — Assessment & Plan Note (Signed)
Much improved with addition of wellbutrin. She is tolerating without side effect. She continues to work with a therapist which she finds helpful. Continue current dose of wellbutrin.

## 2022-06-03 NOTE — Progress Notes (Signed)
Subjective:   By signing my name below, I, Belinda Bryan, attest that this documentation has been prepared under the direction and in the presence of Belinda Alar, NP.  06/03/2022.   Patient ID: Belinda Bryan, female    DOB: 1995/03/28, 27 y.o.   MRN: [id number]  Chief Complaint  Patient presents with   Depression    Here for follow up, "doing well on welbutrin"    HPI Patient is in today for an office visit.  Mood:  At her last visit 04/23/22 we started her on 150 mg bupropion daily. Lately she states that things have changed for the better. It is easier to initiate activities and she is not feeling as anxious due to being able to complete tasks. She notes that her brain is more quiet as well. She complains of some dry mouth as a side effect of her Wellbutrin, but this has been managed by drinking more water. She continues to work with her therapist.   Past Medical History:  Diagnosis Date   Acne 07/16/2014   Cardiac murmur 12/02/2019   Decreased hearing of right ear 07/16/2014   Left arm weakness 05/17/2014   Formatting of this note might be different from the original. 27 yo female referred to Dr. Williemae Natter by [facility], Dr. Elon Alas. Willis for thoracic outlet syndrome, left.  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   Thoracic outlet syndrome 05/17/2014   Trochanteric bursitis of left hip 03/15/2018    Past Surgical History:  Procedure Laterality Date   THORACIC OUTLET SURGERY      Family History  Problem Relation Age of Onset   Hypertension Maternal Grandmother    Hyperlipidemia Maternal Grandmother    COPD Paternal Grandmother    Heart block Paternal Grandmother     Hyperlipidemia Paternal Grandfather    Sudden death Neg Hx    Heart attack Neg Hx     Social History   Socioeconomic History   Marital status: Single    Spouse name: Not on file   Number of children: Not on file   Years of education: Not on file   Highest education level: Not on file  Occupational History   Occupation: student  Tobacco Use   Smoking status: Never   Smokeless tobacco: Never  Vaping Use   Vaping Use: Never used  Substance and Sexual Activity   Alcohol use: Yes    Alcohol/week: 1.0 standard drink of alcohol    Types: 1 Standard drinks or equivalent per week   Drug use: No   Sexual activity: Yes    Partners: Male    Birth control/protection: Condom, Pill  Other Topics Concern   Not on file  Social History Narrative   Patient is right handed.   Patient drinks 1 cup of caffeine daily.   Will Attend an OT program    Has one older brother   Has a dog   Social Determinants of Health   Financial Resource Strain: Not on file  Food Insecurity: Not on file  Transportation Needs: Not on file  Physical Activity: Not on file  Stress: Not on file  Social Connections: Not on file  Intimate Partner Violence: Not  on file    Outpatient Medications Prior to Visit  Medication Sig Dispense Refill   acetaminophen (TYLENOL) 500 MG tablet Take 500 mg by mouth as needed.     Multiple Vitamins-Minerals (MULTIVITAMIN WITH MINERALS) tablet Take 1 tablet by mouth daily.     NON FORMULARY retna cream     SPRINTEC 28 0.25-35 MG-MCG tablet Take 1 tablet by mouth once a day 84 tablet 3   SUMAtriptan (IMITREX) 50 MG tablet Take 1 tablet (50 mg total) by mouth every 2 (two) hours as needed for migraine. May repeat in 2 hours if headache persists or recurs. 10 tablet 3   tazarotene (TAZORAC) 0.1 % gel Apply topically at bedtime. 30 g 2   buPROPion (WELLBUTRIN XL) 150 MG 24 hr tablet Take 1 tablet (150 mg total) by mouth daily. 30 tablet 0   Clascoterone (WINLEVI) 1 % CREA Apply  a small amount to affected skin once a day 60 g 0   No facility-administered medications prior to visit.    Allergies  Allergen Reactions   Gardasil [Hpv 4-Valent Vaccine Recombinant Vaccine]     Arm swelling    ROS See HPI.     Objective:    Physical Exam Constitutional:      Appearance: Normal appearance.  HENT:     Head: Normocephalic and atraumatic.     Right Ear: Tympanic membrane, ear canal and external ear normal.     Left Ear: Tympanic membrane, ear canal and external ear normal.  Eyes:     Extraocular Movements: Extraocular movements intact.     Pupils: Pupils are equal, round, and reactive to light.  Cardiovascular:     Rate and Rhythm: Normal rate and regular rhythm.     Heart sounds: Normal heart sounds. No murmur heard.    No gallop.  Pulmonary:     Effort: Pulmonary effort is normal. No respiratory distress.     Breath sounds: Normal breath sounds. No wheezing or rales.  Skin:    General: Skin is warm and dry.  Neurological:     General: No focal deficit present.     Mental Status: She is alert and oriented to person, place, and time.  Psychiatric:        Mood and Affect: Mood normal.        Behavior: Behavior normal.     BP 118/61 (BP Location: Right Arm, Patient Position: Sitting, Cuff Size: Small)   Pulse 69   Temp 98 F (36.7 C) (Oral)   Resp 16   Wt 172 lb (78 kg)   SpO2 100%   BMI 28.62 kg/m  Wt Readings from Last 3 Encounters:  06/03/22 172 lb (78 kg)  02/28/22 172 lb (78 kg)  11/29/21 170 lb (77.1 kg)       Assessment & Plan:   Problem List Items Addressed This Visit       Unprioritized   Anxiety and depression - Primary    Much improved with addition of wellbutrin. She is tolerating without side effect. She continues to work with a therapist which she finds helpful. Continue current dose of wellbutrin.       Relevant Medications   buPROPion (WELLBUTRIN XL) 150 MG 24 hr tablet     Meds ordered this encounter   Medications   buPROPion (WELLBUTRIN XL) 150 MG 24 hr tablet    Sig: Take 1 tablet (150 mg total) by mouth daily.    Dispense:  90 tablet    Refill:  0    Order Specific Question:   Supervising Provider    Answer:   Penni Homans A [[id number]]    I, [name], NP, personally preformed the services described in this documentation.  All medical record entries made by the scribe were at my direction and in my presence.  I have reviewed the chart and discharge instructions (if applicable) and agree that the record reflects my personal performance and is accurate and complete. 06/03/2022.  I,Mathew Stumpf,acting as a Education administrator for [employer], NP.,have documented all relevant documentation on the behalf of [name], NP,as directed by  [name], NP while in the presence of [name], NP.   [name], NP

## 2022-06-13 ENCOUNTER — Other Ambulatory Visit (HOSPITAL_BASED_OUTPATIENT_CLINIC_OR_DEPARTMENT_OTHER): Payer: Self-pay

## 2022-06-13 LAB — HM PAP SMEAR: HM Pap smear: NEGATIVE

## 2022-06-13 MED ORDER — DROSPIRENONE-ETHINYL ESTRADIOL 3-0.03 MG PO TABS
1.0000 | ORAL_TABLET | Freq: Every day | ORAL | 3 refills | Status: DC
  Filled 2022-06-13: qty 84, 84d supply, fill #0
  Filled 2022-09-15: qty 84, 84d supply, fill #1
  Filled 2022-12-28: qty 84, 84d supply, fill #2

## 2022-07-15 ENCOUNTER — Ambulatory Visit: Payer: 59 | Admitting: Neurology

## 2022-07-22 NOTE — Progress Notes (Unsigned)
NEUROLOGY CONSULTATION NOTE  Belinda Bryan MRN: [id number] DOB: 1995-11-07  Referring provider: Sandford Craze, NP Primary care provider: Sandford Craze, NP  Reason for consult:  migraines  Assessment/Plan:   Migraine with aura, without status migrainosus, not intractable - previously chronic but now stable.  Migraine prevention:  Not indicated Migraine rescue:  sumatriptan 50mg  (or ibuprofen and acetaminophen) as needed but limit use to no more than 2 days out of week to prevent risk of rebound or medication-overuse headache. Keep headache diary As she does have aura with her migraines, recommend discontinuing estrogen-based birth control Follow up as needed.    Subjective:  Belinda Bryan is a 27 year old female with history of left thoracic outlet syndrome s/p surgery who presents for migraines.  History supplemented by referring provider's note.  Onset:  27 years old.  Location:  starts at base of her skull bilaterally spreading over her head to front Quality:  pressure, stabbing Intensity:  7/10.   Aura:  vision loss in left eye (white out and blurred) throughout entire duration of migraine Prodrome:  absent Associated symptoms:  Nausea, photophobia, phonophobia.  She denies associated vomiting, unilateral numbness or weakness. Duration:  2 hours.  Lasts 90 minutes with treatment. Frequency:  They were frequent and almost daily for a couple of years.  They have improved around January and February 2024.  Now (as of May 2024) they occur twice a month.  She is unsure why they have improved.  She actually has more stress now. Frequency of abortive medication: 2 times a month Triggers:  change in weather, less sleep.  Not associated with menses Relieving factors:  rest in dark. Activity:  aggravates.  Past NSAIDS/analgesics:  Tylenol, meloxicam Past abortive triptans:  none Past abortive ergotamine:  none Past muscle relaxants:  none Past anti-emetic:   none Past antihypertensive medications:  propranolol Past antidepressant medications:  none Past anticonvulsant medications:  gabapentin Past anti-CGRP:  none Past vitamins/Herbal/Supplements:  magnesium Past antihistamines/decongestants:  none Other past therapies:  none  Current NSAIDS/analgesics:  acetaminophen, ibuprofen Current triptans:  sumatriptan 50mg  Current ergotamine:  none Current anti-emetic:  none Current muscle relaxants:  none Current Antihypertensive medications:  none Current Antidepressant medications:  Wellbutrin XL 150mg  daily Current Anticonvulsant medications:  none Current anti-CGRP:  none Current Vitamins/Herbal/Supplements:  MVI Current Antihistamines/Decongestants:  none Other therapy:  none Birth control:  Ocella   Caffeine:  coffee once a week.  Soda twice a week. Alcohol:  occasional (no more than once a week) Smoker:  no Diet:  46 oz water daily.  Sometimes skips meals but not on purpose Exercise:  yes.  Cardio and weights. Depression:  stable; Anxiety:  stable Sleep hygiene:  varies.  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 Family history of headache:  mom (now not as frequent)  MRI of cervical spine on 12/21/2013 personally reviewed was normal.   PAST MEDICAL HISTORY: Past Medical History:  Diagnosis Date   Acne 07/16/2014   Cardiac murmur 12/02/2019   Decreased hearing of right ear 07/16/2014   Left arm weakness 05/17/2014   Formatting of this note might be different from the original. 27 yo female referred to Dr. Glenna Durand by [facility], Dr. Hennie Duos. Willis for thoracic outlet syndrome, left.  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  Extensive workup including MRI of the C-spine, chest "  area", arterial doppler   Lumbar radiculopathy 03/15/2018   Lumbar scoliosis 12/14/2017   Palpitations 12/02/2019   Snapping hip  syndrome, left 03/15/2018   Thoracic outlet syndrome 05/17/2014   Trochanteric bursitis of left hip 03/15/2018    PAST SURGICAL HISTORY: Past Surgical History:  Procedure Laterality Date   THORACIC OUTLET SURGERY      MEDICATIONS: Current Outpatient Medications on File Prior to Visit  Medication Sig Dispense Refill   acetaminophen (TYLENOL) 500 MG tablet Take 500 mg by mouth as needed.     buPROPion (WELLBUTRIN XL) 150 MG 24 hr tablet Take 1 tablet (150 mg total) by mouth daily. 90 tablet 0   drospirenone-ethinyl estradiol (OCELLA) 3-0.03 MG tablet Take 1 tablet by mouth daily. 84 tablet 3   Multiple Vitamins-Minerals (MULTIVITAMIN WITH MINERALS) tablet Take 1 tablet by mouth daily.     NON FORMULARY retna cream     SPRINTEC 28 0.25-35 MG-MCG tablet Take 1 tablet by mouth once a day 84 tablet 3   SUMAtriptan (IMITREX) 50 MG tablet Take 1 tablet (50 mg total) by mouth every 2 (two) hours as needed for migraine. May repeat in 2 hours if headache persists or recurs. 10 tablet 3   tazarotene (TAZORAC) 0.1 % gel Apply topically at bedtime. 30 g 2   No current facility-administered medications on file prior to visit.    ALLERGIES: Allergies  Allergen Reactions   Gardasil [Hpv 4-Valent Vaccine Recombinant Vaccine]     Arm swelling    FAMILY HISTORY: Family History  Problem Relation Age of Onset   Hypertension Maternal Grandmother    Hyperlipidemia Maternal Grandmother    COPD Paternal Grandmother    Heart block Paternal Grandmother    Hyperlipidemia Paternal Grandfather    Sudden death Neg Hx    Heart attack Neg Hx     Objective:  Blood pressure (!) 102/55, pulse 71, height 5\' 7"  (1.702 m), weight 171 lb (77.6 kg), SpO2 91 %. General: No acute distress.  Patient appears well-groomed.   Head:  Normocephalic/atraumatic Eyes:  fundi examined but not visualized Neck: supple, no paraspinal tenderness, full range of motion Heart: regular rate and rhythm Neurological Exam: Mental  status: alert and oriented to person, place, and time, speech fluent and not dysarthric, language intact. Cranial nerves: CN I: not tested CN II: pupils equal, round and reactive to light, visual fields intact CN III, IV, VI:  full range of motion, no nystagmus, no ptosis CN V: facial sensation intact. CN VII: upper and lower face symmetric CN VIII: hearing intact CN IX, X: gag intact, uvula midline CN XI: sternocleidomastoid and trapezius muscles intact CN XII: tongue midline Bulk & Tone: normal, no fasciculations. Motor:  muscle strength 5/5 throughout Sensation:  Pinprick, temperature and vibratory sensation intact. Deep Tendon Reflexes:  2+ throughout,  toes downgoing.   Finger to nose testing:  Without dysmetria.   Heel to shin:  Without dysmetria.   Gait:  Normal station and stride.  Romberg negative.    Thank you for allowing me to take part in the care of this patient.  Shon Millet, DO  CC: Sandford Craze, NP

## 2022-07-23 ENCOUNTER — Encounter: Payer: Self-pay | Admitting: Family

## 2022-07-23 ENCOUNTER — Other Ambulatory Visit (HOSPITAL_BASED_OUTPATIENT_CLINIC_OR_DEPARTMENT_OTHER): Payer: Self-pay

## 2022-07-23 MED ORDER — BUPROPION HCL ER (XL) 150 MG PO TB24
150.0000 mg | ORAL_TABLET | Freq: Every day | ORAL | 0 refills | Status: DC
  Filled 2022-07-23 – 2022-07-28 (×3): qty 90, 90d supply, fill #0

## 2022-07-24 ENCOUNTER — Encounter: Payer: Self-pay | Admitting: Neurology

## 2022-07-24 ENCOUNTER — Ambulatory Visit: Payer: 59 | Admitting: Neurology

## 2022-07-24 VITALS — BP 102/55 | HR 71 | Ht 67.0 in | Wt 171.0 lb

## 2022-07-24 DIAGNOSIS — G43001 Migraine without aura, not intractable, with status migrainosus: Secondary | ICD-10-CM | POA: Diagnosis not present

## 2022-07-25 ENCOUNTER — Other Ambulatory Visit (HOSPITAL_BASED_OUTPATIENT_CLINIC_OR_DEPARTMENT_OTHER): Payer: Self-pay

## 2022-07-28 ENCOUNTER — Other Ambulatory Visit (HOSPITAL_BASED_OUTPATIENT_CLINIC_OR_DEPARTMENT_OTHER): Payer: Self-pay

## 2022-07-31 NOTE — Progress Notes (Incomplete)
Subjective:   By signing my name below, I, Belinda Bryan, attest that this documentation has been prepared under the direction and in the presence of Lemont Fillers, NP 08/02/22   Patient ID: Belinda Bryan, female    DOB: 1995-11-26, 27 y.o.   MRN: [id number]  Chief Complaint  Patient presents with   Depression    Doing well on medication    HPI Patient is in today for a follow up.   Depression:  Her mood has improved with 150 mg Wellbutrin XL every morning. She denies any side effects.   Migraines: She has not had any migraines recently. When needed, she states Imitrex tends to help.   Last pap:  Her last pap was done in 06/2022.   Past Medical History:  Diagnosis Date   Acne 07/16/2014   Cardiac murmur 12/02/2019   Decreased hearing of right ear 07/16/2014   Depression    Left arm weakness 05/17/2014   Formatting of this note might be different from the original. 27 yo female referred to Dr. Glenna Durand by [facility], Dr. Hennie Duos. Willis for thoracic outlet syndrome, left.  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   Thoracic outlet syndrome 05/17/2014   Trochanteric bursitis of left hip 03/15/2018    Past Surgical History:  Procedure Laterality Date   THORACIC OUTLET SURGERY      Family History  Problem Relation Age of Onset   Neuropathy Maternal Grandmother    Hypertension Maternal Grandmother    Hyperlipidemia Maternal Grandmother    COPD Paternal Grandmother    Heart block Paternal Grandmother    Dementia Paternal Grandfather    Hyperlipidemia Paternal Grandfather    Sudden death Neg Hx    Heart attack Neg Hx     Social History    Socioeconomic History   Marital status: Single    Spouse name: Not on file   Number of children: Not on file   Years of education: Not on file   Highest education level: Not on file  Occupational History   Occupation: student  Tobacco Use   Smoking status: Never   Smokeless tobacco: Never  Vaping Use   Vaping Use: Never used  Substance and Sexual Activity   Alcohol use: Yes    Alcohol/week: 1.0 standard drink of alcohol    Types: 1 Standard drinks or equivalent per week   Drug use: No   Sexual activity: Yes    Partners: Male    Birth control/protection: Condom, Pill  Other Topics Concern   Not on file  Social History Narrative   Patient is right handed.   Patient drinks 1 cup of caffeine daily.   Will Attend an OT program    Has one older brother   Has a dog   Social Determinants of Health   Financial Resource Strain: Not on file  Food Insecurity: Not on file  Transportation Needs: Not on file  Physical Activity: Not on file  Stress: Not on file  Social Connections: Not on file  Intimate Partner Violence: Not on file    Outpatient Medications Prior to Visit  Medication Sig Dispense Refill   acetaminophen (TYLENOL) 500 MG tablet Take  500 mg by mouth as needed.     drospirenone-ethinyl estradiol (OCELLA) 3-0.03 MG tablet Take 1 tablet by mouth daily. 84 tablet 3   Multiple Vitamins-Minerals (MULTIVITAMIN WITH MINERALS) tablet Take 1 tablet by mouth daily.     NON FORMULARY retna cream     tazarotene (TAZORAC) 0.1 % gel Apply topically at bedtime. 30 g 2   buPROPion (WELLBUTRIN XL) 150 MG 24 hr tablet Take 1 tablet (150 mg total) by mouth daily. 90 tablet 0   SUMAtriptan (IMITREX) 50 MG tablet Take 1 tablet (50 mg total) by mouth every 2 (two) hours as needed for migraine. May repeat in 2 hours if headache persists or recurs. 10 tablet 3   No facility-administered medications prior to visit.    Allergies  Allergen Reactions   Gardasil [Hpv 4-Valent Vaccine  Recombinant Vaccine]     Arm swelling    ROS    See HPI Objective:    Physical Exam Constitutional:      General: She is not in acute distress.    Appearance: Normal appearance. She is well-developed.  HENT:     Head: Normocephalic and atraumatic.     Right Ear: External ear normal.     Left Ear: External ear normal.  Eyes:     General: No scleral icterus. Neck:     Thyroid: No thyromegaly.  Cardiovascular:     Rate and Rhythm: Normal rate and regular rhythm.     Heart sounds: Normal heart sounds. No murmur heard. Pulmonary:     Effort: Pulmonary effort is normal. No respiratory distress.     Breath sounds: Normal breath sounds. No wheezing.  Musculoskeletal:     Cervical back: Neck supple.  Skin:    General: Skin is warm and dry.  Neurological:     Mental Status: She is alert and oriented to person, place, and time.  Psychiatric:        Mood and Affect: Mood normal.        Behavior: Behavior normal.        Thought Content: Thought content normal.        Judgment: Judgment normal.     BP 115/67 (BP Location: Right Arm, Patient Position: Sitting, Cuff Size: Small)   Pulse 65   Temp 98.1 F (36.7 C) (Oral)   Resp 16   Wt 166 lb (75.3 kg)   SpO2 100%   BMI 26.00 kg/m  Wt Readings from Last 3 Encounters:  08/01/22 166 lb (75.3 kg)  07/24/22 171 lb (77.6 kg)  06/03/22 172 lb (78 kg)       Assessment & Plan:  Encounter for surveillance of contraceptive pills Assessment & Plan: Continues ocella.     Migraine without status migrainosus, not intractable, unspecified migraine type Assessment & Plan: Stable with prn use of imitrex.    Other orders -     buPROPion HCl ER (XL); Take 1 tablet (150 mg total) by mouth daily.  Dispense: 90 tablet; Refill: 1 -     SUMAtriptan Succinate; Take 1 tablet (50 mg total) by mouth every 2 (two) hours as needed for migraine. May repeat in 2 hours if headache persists or recurs.  Dispense: 10 tablet; Refill:  3  Anxiety/Depression- stable on daily wellbutrin xl 150mg  once daily. Continue same.   I,Rachel Rivera,acting as a Neurosurgeon for Lemont Fillers, NP.,have documented all relevant documentation on the behalf of Lemont Fillers, NP,as directed by  Lemont Fillers, NP while in the  presence of Lemont Fillers, NP.   I, Lemont Fillers, NP, personally preformed the services described in this documentation.  All medical record entries made by the scribe were at my direction and in my presence.  I have reviewed the chart and discharge instructions (if applicable) and agree that the record reflects my personal performance and is accurate and complete. 08/02/22   Lemont Fillers, NP

## 2022-08-01 ENCOUNTER — Telehealth: Payer: Self-pay | Admitting: Family

## 2022-08-01 ENCOUNTER — Ambulatory Visit (INDEPENDENT_AMBULATORY_CARE_PROVIDER_SITE_OTHER): Payer: 59 | Admitting: Family

## 2022-08-01 ENCOUNTER — Other Ambulatory Visit (HOSPITAL_BASED_OUTPATIENT_CLINIC_OR_DEPARTMENT_OTHER): Payer: Self-pay

## 2022-08-01 VITALS — BP 115/67 | HR 65 | Temp 98.1°F | Resp 16 | Wt 166.0 lb

## 2022-08-01 DIAGNOSIS — G43909 Migraine, unspecified, not intractable, without status migrainosus: Secondary | ICD-10-CM | POA: Diagnosis not present

## 2022-08-01 DIAGNOSIS — Z3041 Encounter for surveillance of contraceptive pills: Secondary | ICD-10-CM | POA: Diagnosis not present

## 2022-08-01 MED ORDER — SUMATRIPTAN SUCCINATE 50 MG PO TABS
50.0000 mg | ORAL_TABLET | ORAL | 3 refills | Status: DC | PRN
  Filled 2022-08-01: qty 18, 30d supply, fill #0
  Filled 2022-12-28: qty 18, 30d supply, fill #1

## 2022-08-01 MED ORDER — BUPROPION HCL ER (XL) 150 MG PO TB24
150.0000 mg | ORAL_TABLET | Freq: Every day | ORAL | 1 refills | Status: DC
  Filled 2022-08-01: qty 90, 90d supply, fill #0
  Filled 2022-12-28: qty 90, 90d supply, fill #1

## 2022-08-01 NOTE — Telephone Encounter (Signed)
Electronic request sent 

## 2022-08-01 NOTE — Assessment & Plan Note (Signed)
Continues ocella.

## 2022-08-01 NOTE — Telephone Encounter (Signed)
Please call [facility] in [place] to request pap smear.

## 2022-08-01 NOTE — Assessment & Plan Note (Signed)
Stable on Wellbutrin xl 150mg  once daily. Continue same.

## 2022-08-01 NOTE — Assessment & Plan Note (Signed)
Stable with prn use of imitrex.  

## 2022-09-28 ENCOUNTER — Telehealth: Payer: 59 | Admitting: Nurse Practitioner

## 2022-09-28 DIAGNOSIS — R3989 Other symptoms and signs involving the genitourinary system: Secondary | ICD-10-CM | POA: Diagnosis not present

## 2022-09-29 MED ORDER — NITROFURANTOIN MONOHYD MACRO 100 MG PO CAPS: 100.0000 mg | ORAL_CAPSULE | Freq: Two times a day (BID) | ORAL | 0 refills | Status: AC

## 2022-09-29 NOTE — Progress Notes (Signed)
E-Visit for Urinary Problems  We are sorry that you are not feeling well.  Here is how we plan to help!  Based on what you shared with me it looks like you most likely have a simple urinary tract infection.  A UTI (Urinary Tract Infection) is a bacterial infection of the bladder.  Most cases of urinary tract infections are simple to treat but a key part of your care is to encourage you to drink plenty of fluids and watch your symptoms carefully.  I have prescribed MacroBid 100 mg twice a day for 5 days.  Your symptoms should gradually improve. Call us if the burning in your urine worsens, you develop worsening fever, back pain or pelvic pain or if your symptoms do not resolve after completing the antibiotic.  Urinary tract infections can be prevented by drinking plenty of water to keep your body hydrated.  Also be sure when you wipe, wipe from front to back and don't hold it in!  If possible, empty your bladder every 4 hours.  HOME CARE Drink plenty of fluids Compete the full course of the antibiotics even if the symptoms resolve Remember, when you need to go.go. Holding in your urine can increase the likelihood of getting a UTI! GET HELP RIGHT AWAY IF: You cannot urinate You get a high fever Worsening back pain occurs You see blood in your urine You feel sick to your stomach or throw up You feel like you are going to pass out  MAKE SURE YOU  Understand these instructions. Will watch your condition. Will get help right away if you are not doing well or get worse.   Thank you for choosing an e-visit.  Your e-visit answers were reviewed by a board certified advanced clinical practitioner to complete your personal care plan. Depending upon the condition, your plan could have included both over the counter or prescription medications.  Please review your pharmacy choice. Make sure the pharmacy is open so you can pick up prescription now. If there is a problem, you may contact your  provider through MyChart messaging and have the prescription routed to another pharmacy.  Your safety is important to us. If you have drug allergies check your prescription carefully.   For the next 24 hours you can use MyChart to ask questions about today's visit, request a non-urgent call back, or ask for a work or school excuse. You will get an email in the next two days asking about your experience. I hope that your e-visit has been valuable and will speed your recovery.   Meds ordered this encounter  Medications   nitrofurantoin, macrocrystal-monohydrate, (MACROBID) 100 MG capsule    Sig: Take 1 capsule (100 mg total) by mouth 2 (two) times daily for 5 days.    Dispense:  10 capsule    Refill:  0     I spent approximately 5 minutes reviewing the patient's history, current symptoms and coordinating their care today.   

## 2023-02-24 ENCOUNTER — Other Ambulatory Visit (HOSPITAL_BASED_OUTPATIENT_CLINIC_OR_DEPARTMENT_OTHER): Payer: Self-pay

## 2023-02-24 ENCOUNTER — Telehealth: Payer: Self-pay

## 2023-02-24 ENCOUNTER — Ambulatory Visit (INDEPENDENT_AMBULATORY_CARE_PROVIDER_SITE_OTHER): Payer: 59 | Admitting: Family

## 2023-02-24 VITALS — BP 115/55 | HR 78 | Temp 98.1°F | Resp 16 | Ht 67.0 in | Wt 166.0 lb

## 2023-02-24 DIAGNOSIS — Z3041 Encounter for surveillance of contraceptive pills: Secondary | ICD-10-CM

## 2023-02-24 DIAGNOSIS — G43909 Migraine, unspecified, not intractable, without status migrainosus: Secondary | ICD-10-CM | POA: Diagnosis not present

## 2023-02-24 DIAGNOSIS — L700 Acne vulgaris: Secondary | ICD-10-CM | POA: Diagnosis not present

## 2023-02-24 MED ORDER — BUPROPION HCL ER (XL) 150 MG PO TB24
150.0000 mg | ORAL_TABLET | Freq: Every day | ORAL | 2 refills | Status: AC
  Filled 2023-02-24: qty 90, 90d supply, fill #0
  Filled 2023-06-23: qty 90, 90d supply, fill #1

## 2023-02-24 MED ORDER — TAZAROTENE 0.1 % EX GEL
1.0000 | Freq: Every day | CUTANEOUS | 2 refills | Status: AC
  Filled 2023-02-24: qty 30, 30d supply, fill #0

## 2023-02-24 MED ORDER — DROSPIRENONE-ETHINYL ESTRADIOL 3-0.03 MG PO TABS
1.0000 | ORAL_TABLET | Freq: Every day | ORAL | 4 refills | Status: AC
  Filled 2023-02-24 – 2023-03-05 (×2): qty 84, 84d supply, fill #0
  Filled 2023-06-19: qty 84, 84d supply, fill #1
  Filled 2023-09-10: qty 84, 84d supply, fill #2

## 2023-02-24 MED ORDER — SUMATRIPTAN SUCCINATE 50 MG PO TABS
50.0000 mg | ORAL_TABLET | ORAL | 3 refills | Status: AC | PRN
  Filled 2023-02-24: qty 10, 30d supply, fill #0
  Filled 2023-06-19: qty 10, 30d supply, fill #1

## 2023-02-24 NOTE — Assessment & Plan Note (Signed)
Reports about 2 migraines a month which resolve with imitrex.

## 2023-02-24 NOTE — Assessment & Plan Note (Signed)
Stable on tazorac gel. Continue same

## 2023-02-24 NOTE — Progress Notes (Signed)
Subjective:     Patient ID: Belinda Bryan, female    DOB: [birth year], 27 y.o.   MRN: [id number]  Chief Complaint  Patient presents with   Depression    Here for follow up, "to discuss wellbutrin"   Contraception    Here to follow up on contraception management, needs rerill    Depression         Discussed the use of AI scribe software for clinical note transcription with the patient, who gave verbal consent to proceed.  History of Present Illness   The patient, with a history of mood disorder and migraines, presents for a routine follow up. She reports a significant improvement in mood since starting Wellbutrin XL once daily, noting a wider range of emotions compared to the previous state of feeling 'bleh and sad.'  Migraines have also improved, occurring approximately twice a month, down from a previous higher frequency. The migraines are severe enough to prevent return to work on the day of occurrence, despite using Imitrex, which she confirms is helpful.  She also uses tazorac gel on her face, which she finds beneficial. She has received her flu shot for the season and is up to date on COVID vaccinations. She is also on birth control, which she will need refills for as she is moving to [place] for a new job.         Health Maintenance Due  Topic Date Due   COVID-19 Vaccine (3 - 2024-25 season) 11/09/2022    Past Medical History:  Diagnosis Date   Acne 07/16/2014   Cardiac murmur 12/02/2019   Decreased hearing of right ear 07/16/2014   Depression    Left arm weakness 05/17/2014   Formatting of this note might be different from the original. 27 yo female referred to Dr. Glenna Durand by [facility], Dr. Hennie Duos. Willis for thoracic outlet syndrome, left.  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   Thoracic outlet syndrome 05/17/2014   Trochanteric bursitis of left hip 03/15/2018    Past Surgical History:  Procedure Laterality Date   THORACIC OUTLET SURGERY      Family History  Problem Relation Age of Onset   Neuropathy Maternal Grandmother    Hypertension Maternal Grandmother    Hyperlipidemia Maternal Grandmother    COPD Paternal Grandmother    Heart block Paternal Grandmother    Dementia Paternal Grandfather    Hyperlipidemia Paternal Grandfather    Sudden death Neg Hx    Heart attack Neg Hx     Social History   Socioeconomic History   Marital status: Single    Spouse name: Not on file   Number of children: Not on file   Years of education: Not on file   Highest education level: Not on file  Occupational History   Occupation: student  Tobacco Use   Smoking status: Never   Smokeless tobacco: Never  Vaping Use   Vaping status: Never Used  Substance and Sexual Activity   Alcohol use: Yes    Alcohol/week: 1.0 standard drink of alcohol    Types: 1 Standard drinks or equivalent per week   Drug use: No   Sexual activity: Yes  Partners: Male    Birth control/protection: Condom, Pill  Other Topics Concern   Not on file  Social History Narrative   Patient is right handed.   Patient drinks 1 cup of caffeine daily.   Will Attend an OT program    Has one older brother   Has a dog   Social Drivers of Corporate investment banker Strain: Not on file  Food Insecurity: Not on file  Transportation Needs: Not on file  Physical Activity: Not on file  Stress: Not on file  Social Connections: Not on file  Intimate Partner Violence: Not on file    Outpatient Medications Prior to Visit  Medication Sig Dispense Refill   acetaminophen (TYLENOL) 500 MG tablet Take 500 mg by mouth as needed.     Multiple Vitamins-Minerals  (MULTIVITAMIN WITH MINERALS) tablet Take 1 tablet by mouth daily.     NON FORMULARY retna cream     buPROPion (WELLBUTRIN XL) 150 MG 24 hr tablet Take 1 tablet (150 mg total) by mouth daily. 90 tablet 1   drospirenone-ethinyl estradiol (OCELLA) 3-0.03 MG tablet Take 1 tablet by mouth daily. 84 tablet 3   SUMAtriptan (IMITREX) 50 MG tablet Take 1 tablet (50 mg total) by mouth every 2 (two) hours as needed for migraine. May repeat in 2 hours if headache persists or recurs. 10 tablet 3   tazarotene (TAZORAC) 0.1 % gel Apply topically at bedtime. 30 g 2   No facility-administered medications prior to visit.    Allergies  Allergen Reactions   Gardasil [Human Papillomavirus 4-Valent Recombinant Vaccine]     Arm swelling   Morphine     Review of Systems  Psychiatric/Behavioral:  Positive for depression.        Objective:    Physical Exam Constitutional:      General: She is not in acute distress.    Appearance: Normal appearance. She is well-developed.  HENT:     Head: Normocephalic and atraumatic.     Right Ear: External ear normal.     Left Ear: External ear normal.  Eyes:     General: No scleral icterus. Neck:     Thyroid: No thyromegaly.  Cardiovascular:     Rate and Rhythm: Normal rate and regular rhythm.     Heart sounds: Normal heart sounds. No murmur heard. Pulmonary:     Effort: Pulmonary effort is normal. No respiratory distress.     Breath sounds: Normal breath sounds. No wheezing.  Musculoskeletal:     Cervical back: Neck supple.  Skin:    General: Skin is warm and dry.  Neurological:     Mental Status: She is alert and oriented to person, place, and time.  Psychiatric:        Mood and Affect: Mood normal.        Behavior: Behavior normal.        Thought Content: Thought content normal.        Judgment: Judgment normal.      BP (!) 115/55 (BP Location: Right Arm, Patient Position: Sitting, Cuff Size: Small)   Pulse 78   Temp 98.1 F (36.7 C) (Oral)    Resp 16   Ht 5\' 7"  (1.702 m)   Wt 166 lb (75.3 kg)   SpO2 99%   BMI 26.00 kg/m  Wt Readings from Last 3 Encounters:  02/24/23 166 lb (75.3 kg)  08/01/22 166 lb (75.3 kg)  07/24/22 171 lb (77.6 kg)       Assessment &  Plan:   Problem List Items Addressed This Visit       Unprioritized   Migraine without status migrainosus, not intractable - Primary   Reports about 2 migraines a month which resolve with imitrex.        Relevant Medications   buPROPion (WELLBUTRIN XL) 150 MG 24 hr tablet   SUMAtriptan (IMITREX) 50 MG tablet   Encounter for surveillance of contraceptive pills   Stable on current OCP, refills provided. Pap up to date.       Acne vulgaris   Stable on tazorac gel. Continue same      Relevant Medications   drospirenone-ethinyl estradiol (OCELLA) 3-0.03 MG tablet    I have changed Jirah Mcginty's tazarotene. I am also having her maintain her multivitamin with minerals, NON FORMULARY, acetaminophen, buPROPion, drospirenone-ethinyl estradiol, and SUMAtriptan.  Meds ordered this encounter  Medications   buPROPion (WELLBUTRIN XL) 150 MG 24 hr tablet    Sig: Take 1 tablet (150 mg total) by mouth daily.    Dispense:  90 tablet    Refill:  2    [place] needs early refill   drospirenone-ethinyl estradiol (OCELLA) 3-0.03 MG tablet    Sig: Take 1 tablet by mouth daily.    Dispense:  84 tablet    Refill:  4   SUMAtriptan (IMITREX) 50 MG tablet    Sig: Take 1 tablet (50 mg total) by mouth every 2 (two) hours as needed for migraine. May repeat in 2 hours if headache persists or recurs.    Dispense:  10 tablet    Refill:  3    Supervising Provider:   Danise Edge A [[id number]]   tazarotene (TAZORAC) 0.1 % gel    Sig: Apply 1 Application topically at bedtime.    Dispense:  30 g    Refill:  2    Supervising Provider:   Danise Edge A [[id number]]

## 2023-02-24 NOTE — Patient Instructions (Signed)
VISIT SUMMARY:  During your visit, we discussed your mood disorder, migraines, acne treatment, and contraception needs. You reported significant improvement in your mood and a reduction in the frequency of your migraines. We also reviewed your general health maintenance, including vaccinations and routine screenings.  YOUR PLAN:  -DEPRESSION: Depression is a mood disorder characterized by persistent feelings of sadness and loss of interest. You are stable on Wellbutrin XL once daily and experiencing a wider range of emotions. Continue taking Wellbutrin XL once daily.  -MIGRAINE: Migraines are severe headaches often accompanied by nausea and sensitivity to light and sound. Your migraines have reduced to about twice a month, and Imitrex is providing relief. Continue using Imitrex as needed.  -ACNE: Acne is a skin condition that occurs when hair follicles become clogged with oil and dead skin cells. You are using terazetine gel on your face with reported improvement. We will refill your terazetine gel, which lasts approximately three months per tube.  -CONTRACEPTION: Contraception refers to methods of preventing pregnancy. You are on birth control and will need refills as you are moving to [place] for a new job. We will refill your birth control for seven months and advise you to transfer your prescription to a [place] pharmacy.  -GENERAL HEALTH MAINTENANCE: General health maintenance includes routine vaccinations and screenings. You have received your flu shot and COVID-19 booster, and you are up to date on your Pap smear. Consider getting another COVID-19 booster.  INSTRUCTIONS:  Follow up as needed when you are back in town.

## 2023-02-24 NOTE — Assessment & Plan Note (Signed)
Stable on current OCP, refills provided. Pap up to date.

## 2023-02-24 NOTE — Telephone Encounter (Signed)
PA initiated via Covermymeds; KEY: [id number].   This drug/product is not covered under the pharmacy benefit. Prior Authorization is not available.  Preferred alternative: TAZAROTENE 0.1% CREAM, TRETINOIN 0.025% GEL, TRETINOIN 0.05% CREAM OR CALCIPOTRIENE OINTMENT / CREAM

## 2023-02-24 NOTE — Telephone Encounter (Signed)
See mychart.  

## 2023-02-26 ENCOUNTER — Other Ambulatory Visit (HOSPITAL_BASED_OUTPATIENT_CLINIC_OR_DEPARTMENT_OTHER): Payer: Self-pay

## 2023-02-27 ENCOUNTER — Other Ambulatory Visit (HOSPITAL_BASED_OUTPATIENT_CLINIC_OR_DEPARTMENT_OTHER): Payer: Self-pay

## 2023-03-02 ENCOUNTER — Other Ambulatory Visit (HOSPITAL_BASED_OUTPATIENT_CLINIC_OR_DEPARTMENT_OTHER): Payer: Self-pay

## 2023-03-03 ENCOUNTER — Other Ambulatory Visit (HOSPITAL_BASED_OUTPATIENT_CLINIC_OR_DEPARTMENT_OTHER): Payer: Self-pay

## 2023-03-05 ENCOUNTER — Other Ambulatory Visit (HOSPITAL_BASED_OUTPATIENT_CLINIC_OR_DEPARTMENT_OTHER): Payer: Self-pay

## 2023-03-13 ENCOUNTER — Other Ambulatory Visit (HOSPITAL_BASED_OUTPATIENT_CLINIC_OR_DEPARTMENT_OTHER): Payer: Self-pay

## 2023-06-22 ENCOUNTER — Other Ambulatory Visit (HOSPITAL_BASED_OUTPATIENT_CLINIC_OR_DEPARTMENT_OTHER): Payer: Self-pay

## 2023-09-14 ENCOUNTER — Encounter (HOSPITAL_BASED_OUTPATIENT_CLINIC_OR_DEPARTMENT_OTHER): Payer: Self-pay | Admitting: Pharmacist

## 2023-09-14 ENCOUNTER — Other Ambulatory Visit (HOSPITAL_BASED_OUTPATIENT_CLINIC_OR_DEPARTMENT_OTHER): Payer: Self-pay

## 2024-03-29 ENCOUNTER — Telehealth: Payer: Self-pay | Admitting: Family

## 2024-03-29 NOTE — Telephone Encounter (Signed)
 Copied from CRM [id number]. Topic: General - Other >> Mar 29, 2024  3:40 PM Donna BRAVO wrote: Reason for CRM: Patient asking for a copy of imaging and reports that were taken on:  12/14/2017  03/29/2018 10/22/2018 12/28/2019  Provided imaging department information address and phone number And medical records information

## 2024-03-31 NOTE — Telephone Encounter (Signed)
 Patient's mother advised we are not the ordering provider for any of the studies and she has to request copies of images from radiology
